# Patient Record
Sex: Female | Born: 1970 | Race: Black or African American | Hispanic: No | Smoking: Never smoker
Health system: Southern US, Community
[De-identification: ages and names within clinical notes are randomized; demographics above are authoritative.]

## PROBLEM LIST (undated history)

## (undated) DIAGNOSIS — T7840XA Allergy, unspecified, initial encounter: Secondary | ICD-10-CM

## (undated) DIAGNOSIS — I10 Essential (primary) hypertension: Secondary | ICD-10-CM

## (undated) DIAGNOSIS — Z98891 History of uterine scar from previous surgery: Secondary | ICD-10-CM

## (undated) HISTORY — DX: Allergy, unspecified, initial encounter: T78.40XA

## (undated) HISTORY — DX: Essential (primary) hypertension: I10

---

## 1996-04-24 HISTORY — PX: LEEP: SHX91

## 1998-03-15 ENCOUNTER — Other Ambulatory Visit: Admission: RE | Admit: 1998-03-15 | Discharge: 1998-03-15 | Payer: Self-pay | Admitting: Obstetrics and Gynecology

## 2005-04-25 ENCOUNTER — Inpatient Hospital Stay (HOSPITAL_COMMUNITY): Admission: AD | Admit: 2005-04-25 | Discharge: 2005-04-25 | Payer: Self-pay | Admitting: Obstetrics & Gynecology

## 2005-05-29 ENCOUNTER — Inpatient Hospital Stay (HOSPITAL_COMMUNITY): Admission: AD | Admit: 2005-05-29 | Discharge: 2005-05-29 | Payer: Self-pay | Admitting: Obstetrics and Gynecology

## 2005-05-30 ENCOUNTER — Inpatient Hospital Stay (HOSPITAL_COMMUNITY): Admission: AD | Admit: 2005-05-30 | Discharge: 2005-06-03 | Payer: Self-pay | Admitting: Obstetrics and Gynecology

## 2005-07-19 ENCOUNTER — Other Ambulatory Visit: Admission: RE | Admit: 2005-07-19 | Discharge: 2005-07-19 | Payer: Self-pay | Admitting: Obstetrics and Gynecology

## 2012-01-04 ENCOUNTER — Other Ambulatory Visit (HOSPITAL_COMMUNITY): Payer: Self-pay | Admitting: Obstetrics and Gynecology

## 2012-01-04 DIAGNOSIS — Z1231 Encounter for screening mammogram for malignant neoplasm of breast: Secondary | ICD-10-CM

## 2012-01-22 ENCOUNTER — Ambulatory Visit (HOSPITAL_COMMUNITY)
Admission: RE | Admit: 2012-01-22 | Discharge: 2012-01-22 | Disposition: A | Discharge status: Home / Self Care | Payer: Self-pay | Source: Ambulatory Visit | Attending: Obstetrics and Gynecology | Admitting: Obstetrics and Gynecology

## 2012-01-22 DIAGNOSIS — Z1231 Encounter for screening mammogram for malignant neoplasm of breast: Secondary | ICD-10-CM

## 2012-01-24 ENCOUNTER — Other Ambulatory Visit: Payer: Self-pay | Admitting: Obstetrics and Gynecology

## 2012-01-24 DIAGNOSIS — R928 Other abnormal and inconclusive findings on diagnostic imaging of breast: Secondary | ICD-10-CM

## 2012-02-05 ENCOUNTER — Encounter (HOSPITAL_COMMUNITY): Payer: Self-pay | Admitting: *Deleted

## 2012-02-13 ENCOUNTER — Encounter (HOSPITAL_COMMUNITY): Payer: Self-pay

## 2012-02-13 ENCOUNTER — Ambulatory Visit (HOSPITAL_COMMUNITY)
Admission: RE | Admit: 2012-02-13 | Discharge: 2012-02-13 | Disposition: A | Discharge status: Home / Self Care | Payer: Self-pay | Source: Ambulatory Visit | Attending: Obstetrics and Gynecology | Admitting: Obstetrics and Gynecology

## 2012-02-13 VITALS — BP 124/86 | Temp 98.8°F | Ht 68.5 in | Wt 195.0 lb

## 2012-02-13 DIAGNOSIS — Z1239 Encounter for other screening for malignant neoplasm of breast: Secondary | ICD-10-CM

## 2012-02-13 HISTORY — DX: History of uterine scar from previous surgery: Z98.891

## 2012-02-13 NOTE — Progress Notes (Signed)
Patient referred to [facility] by the [facility] for additional imaging of left breast. Screening mammogram was completed at [facility] 01/22/2012.  Pap Smear:    Pap smear not performed today. Per patient last Pap smear was in 2011 and normal. Unable to complete Pap smear today due to patient being on menstrual cycle. Patient to call us back to schedule Pap smear. No Pap smear results in EPIC.  Physical exam: Breasts Breasts symmetrical. No skin abnormalities bilateral breasts. No nipple retraction bilateral breasts. No nipple discharge bilateral breasts. No lymphadenopathy. No lumps palpated bilateral breasts. No complaints of pain or tenderness on exam. Referred patient to the [facility] for left breast diagnostic mammogram per recommendation and possible ultrasound. Appointment scheduled for Monday, February 19, 2012 at 1030.    Pelvic/Bimanual No Pap smear completed today since patient is on menstrual cycle. Patient to call and schedule Pap smear.

## 2012-02-13 NOTE — Patient Instructions (Addendum)
Taught patient how to perform BSE and gave educational materials to take home. Unable to complete Pap smear today due to patient is on menstrual cycle. Told patient to call us back to schedule Pap smear. Referred patient to the [facility] for left breast diagnostic mammogram and possible ultrasound. Appointment scheduled for Monday, February 19, 2012 at 1030. Patient aware of appointment and will be there. Let patient know will follow up with her within the next couple weeks with results. Patient verbalized understanding.

## 2012-02-15 ENCOUNTER — Other Ambulatory Visit: Payer: Self-pay | Admitting: Obstetrics and Gynecology

## 2012-02-15 DIAGNOSIS — R928 Other abnormal and inconclusive findings on diagnostic imaging of breast: Secondary | ICD-10-CM

## 2012-02-19 ENCOUNTER — Ambulatory Visit
Admission: RE | Admit: 2012-02-19 | Discharge: 2012-02-19 | Disposition: A | Discharge status: Home / Self Care | Payer: No Typology Code available for payment source | Source: Ambulatory Visit | Attending: Obstetrics and Gynecology | Admitting: Obstetrics and Gynecology

## 2012-02-19 DIAGNOSIS — R928 Other abnormal and inconclusive findings on diagnostic imaging of breast: Secondary | ICD-10-CM

## 2012-04-25 NOTE — Addendum Note (Signed)
Encounter addended by: Christine Poll Brannock, RN on: 04/25/2012  3:35 PM<BR>     Documentation filed: Visit Diagnoses, Charges VN

## 2012-05-14 ENCOUNTER — Ambulatory Visit (HOSPITAL_COMMUNITY)
Admission: RE | Admit: 2012-05-14 | Discharge: 2012-05-14 | Disposition: A | Discharge status: Home / Self Care | Payer: Self-pay | Source: Ambulatory Visit | Attending: Obstetrics and Gynecology | Admitting: Obstetrics and Gynecology

## 2012-05-14 ENCOUNTER — Encounter (HOSPITAL_COMMUNITY): Payer: Self-pay

## 2012-05-14 VITALS — BP 130/82 | Temp 98.7°F | Ht 68.5 in | Wt 198.0 lb

## 2012-05-14 DIAGNOSIS — Z01419 Encounter for gynecological examination (general) (routine) without abnormal findings: Secondary | ICD-10-CM

## 2012-05-14 NOTE — Patient Instructions (Signed)
Completed Pap smear today. Recommended patient have next Pap smear in 2 years if Pap smear normal since last Pap smear was around 7 years a ago and she stated she had a LEEP in 1999 for an abnormal Pap smear. Told patient she will need a screening mammogram in October 2014 and that she will need to schedule an appointment with [facility] to have her next mammogram covered through [facility].  Let patient know will follow up with her within the next couple weeks with results by letter or phone. Patient verbalized understanding.

## 2012-05-14 NOTE — Progress Notes (Signed)
No complaints today.  Pap Smear:    Pap smear completed today. Per patient last Pap smear was in 2007 and normal. Per patient had an abnormal Pap smear in 1999 that required a LEEP for follow up. No Pap smear results in EPIC.  Physical exam: Breasts Breast Exam not completed today since last exam was 02/13/2012.         Pelvic/Bimanual   Ext Genitalia No lesions, no swelling and no discharge observed on external genitalia.         Vagina Vagina pink and normal texture. No lesions and large amount of cervical mucous observed in vagina.          Cervix Cervix is present. Cervix pink and of normal texture. Cervical mucous observed on cervix.     Uterus Uterus is present and palpable. Uterus in normal position and normal size.        Adnexae Bilateral ovaries present and palpable. No tenderness on palpation.          Rectovaginal No rectal exam completed today since patient had no rectal complaints. No skin abnormalities observed on exam.

## 2012-06-17 ENCOUNTER — Encounter (HOSPITAL_COMMUNITY): Payer: Self-pay | Admitting: *Deleted

## 2012-06-21 ENCOUNTER — Telehealth (HOSPITAL_COMMUNITY): Payer: Self-pay | Admitting: *Deleted

## 2012-06-21 NOTE — Telephone Encounter (Signed)
Telephoned patient at home # and discussed negative pap smear results. Next pap smear due in 3 years. Patient voiced understanding.  

## 2014-02-23 ENCOUNTER — Encounter (HOSPITAL_COMMUNITY): Payer: Self-pay

## 2014-04-07 ENCOUNTER — Other Ambulatory Visit: Payer: Self-pay | Admitting: Obstetrics and Gynecology

## 2014-04-09 LAB — CYTOLOGY - PAP

## 2019-05-08 ENCOUNTER — Other Ambulatory Visit: Payer: Self-pay

## 2019-05-08 DIAGNOSIS — Z20822 Contact with and (suspected) exposure to covid-19: Secondary | ICD-10-CM

## 2019-05-09 ENCOUNTER — Telehealth: Payer: Self-pay

## 2019-05-09 LAB — NOVEL CORONAVIRUS, NAA: SARS-CoV-2, NAA: DETECTED — AB

## 2019-05-09 NOTE — Telephone Encounter (Signed)
Patient called in requesting confirmation of COVID19 lab results - DOB/Address verified  - confirmed Positive results. Reviewed Patient Positive Test and MyChart Companion Algorithm for Home Monitoring of [id number] with patient.  Patient reports the following symptoms:  Loss of taste/smell; dry cough. Patient does not have a PCP.  Patient verbalized understanding of instructions - no further questions.

## 2019-05-12 ENCOUNTER — Encounter: Payer: Self-pay | Admitting: *Deleted

## 2019-05-15 ENCOUNTER — Telehealth: Payer: Self-pay | Admitting: Family Medicine

## 2019-05-15 NOTE — Telephone Encounter (Signed)
Hello,  Patient is requesting to become a new patient. She a good friend of Lewanda Rife. Please Advise

## 2019-05-19 NOTE — Telephone Encounter (Signed)
DONE

## 2019-05-29 NOTE — Progress Notes (Addendum)
[facility] [address], [address] [place], [place] [address] [phone]  Date:  06/02/2019   Name:  Lynn Smith   DOB:  12/30/1970   MRN:  [id number]  PCP:  Pearline Cables, MD    Chief Complaint: New Patient (Initial Visit) (check up)   History of Present Illness:  Britaney Espaillat is a 50 y.o. 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  No breast changes or concerns  She would like colon cancer screening- her father died of colon cancer. Will refer to GI She did do cologuard a couple of times but would now like to pursue a colonoscopy  No recent labs  Give flu shot today   In her free time she enjoys reading, investing She has a 70 yo son who is in 8th grade.  He is learning online this year but we hope he will be back to in person learning next year for HS  Her menses are regular still  On exam noted likely frequent PVC. Pt has noted palpations since her covid infection, no CP or SOB however    There are no problems to display for this patient.   Past Medical History:  Diagnosis Date  . History of C-section     Past Surgical History:  Procedure Laterality Date  . CESAREAN SECTION    . LEEP  1998    Social History   Tobacco Use  . Smoking status: Never Smoker  . Smokeless tobacco: Never Used  Substance Use Topics  . Alcohol use: No  . Drug use: No    Family History  Problem Relation Age of Onset  . Hypertension Mother   . Cancer Mother        uterus  . Hypertension Father    . Cancer Father        colon and lung  . Hypertension Sister     No Known Allergies  Medication list has been reviewed and updated.  Current Outpatient Medications on File Prior to Visit  Medication Sig Dispense Refill  . Cholecalciferol (VITAMIN D3 PO) Take by mouth.    . Cinnamon 500 MG TABS Take by mouth.    . Lactobacillus (ACIDOPHILUS PO) Take by mouth.    . Multiple Vitamin (MULTIVITAMIN) tablet Take 1 tablet by mouth daily.    Marland Kitchen PHYTOSTEROLS PO Take by mouth.     No current facility-administered medications on file prior to visit.    Review of Systems:  As per HPI- otherwise negative.   Physical Examination: Vitals:   06/02/19 1016  BP: 132/90  Pulse: (!) 110  Resp: 16  Temp: (!) 97.2 F (36.2 C)  SpO2: 97%   Vitals:   06/02/19 1016  Weight: 193 lb (87.5 kg)  Height: 5\' 8"  (1.727 m)   Body mass index is 29.35 kg/m. Ideal Body Weight: Weight in (lb) to have BMI = 25: 164.1  GEN: WDWN, NAD, Non-toxic, A &  O x 3, overweight, looks well  HEENT: Atraumatic, Normocephalic. Neck supple. No masses, No LAD. Ears and Nose: No external deformity. CV: RRR, No M/G/R. No JVD. No thrill. No extra heart sounds. PULM: CTA B, no wheezes, crackles, rhonchi. No retractions. No resp. distress. No accessory muscle use. ABD: S, NT, ND, +BS. No rebound. No HSM. EXTR: No c/c/e NEURO Normal gait.  PSYCH: Normally interactive. Conversant. Not depressed or anxious appearing.  Calm demeanor.   EKG: sinus tach with frequent PACs- pt has no CP or SOB  Assessment and Plan: Palpitations - Plan: EKG 12-Lead  Screening for cervical cancer - Plan: Cytology - PAP  Encounter for screening mammogram for malignant neoplasm of breast - Plan: MM 3D SCREEN BREAST BILATERAL  Screening for colon cancer - Plan: Ambulatory referral to Gastroenterology  Screening for deficiency anemia - Plan: CBC  Screening for diabetes mellitus - Plan: Comprehensive metabolic panel, Hemoglobin  A1c  Screening for thyroid disorder - Plan: TSH  Screening for hyperlipidemia - Plan: Lipid panel  Cough with exposure to COVID-19 virus - Plan: DG Chest 2 View  Abnormal EKG - Plan: Troponin I (High Sensitivity)  In person visit today to establish care and catch up on health maint Pap and mammo today Labs pending Flu shot given Defer shingrix due to recent covid 19 infection  This visit occurred during the SARS-CoV-2 public health emergency.  Safety protocols were in place, including screening questions prior to the visit, additional usage of staff PPE, and extensive cleaning of exam room while observing appropriate contact time as indicated for disinfecting solutions.   Signed Abbe Amsterdam, MD  DG Chest 2 View  Result Date: 06/02/2019 CLINICAL DATA:  Cough after COVID-19 infection EXAM: CHEST - 2 VIEW COMPARISON:  None. FINDINGS: The heart size and mediastinal contours are within normal limits. Both lungs are clear. The visualized skeletal structures are unremarkable. IMPRESSION: No active cardiopulmonary disease. Electronically Signed   By: Elige Ko   On: 06/02/2019 11:28   I was able to speak with cardiology DOD, Dr. Jens Som.  He kindly reviewed her EKG and did suggest an echocardiogram as initial assessment Also received her labs as below Call patient as a result so far, and to set up echo Advised her as to prediabetes, encouraged diet and gradual weight loss.  Assuming echo is okay, would also recommend increasing exercise She states understanding and agreement  The 10-year ASCVD risk score Denman George DC Montez Hageman., et al., 2013) is: 2.1%   Values used to calculate the score:     Age: 37 years     Sex: Female     Is Non-Hispanic African American: Yes     Diabetic: No     Tobacco smoker: No     Systolic Blood Pressure: 132 mmHg     Is BP treated: No     HDL Cholesterol: 62.5 mg/dL     Total Cholesterol: 230 mg/dL   Results for orders placed or performed in visit on 06/02/19   CBC  Result Value Ref Range   WBC 5.2 4.0 - 10.5 K/uL   RBC 4.21 3.87 - 5.11 Mil/uL   Platelets 305.0 150.0 - 400.0 K/uL   Hemoglobin 12.7 12.0 - 15.0 g/dL   HCT 08.1 44.8 - 18.5 %   MCV 90.3 78.0 - 100.0 fl   MCHC 33.4 30.0 - 36.0 g/dL   RDW 63.1 49.7 - 02.6 %  Comprehensive metabolic panel  Result Value Ref Range   Sodium 138 135 -  145 mEq/L   Potassium 3.8 3.5 - 5.1 mEq/L   Chloride 106 96 - 112 mEq/L   CO2 25 19 - 32 mEq/L   Glucose, Bld 94 70 - 99 mg/dL   BUN 10 6 - 23 mg/dL   Creatinine, Ser 0.81 0.40 - 1.20 mg/dL   Total Bilirubin 0.5 0.2 - 1.2 mg/dL   Alkaline Phosphatase 65 39 - 117 U/L   AST 17 0 - 37 U/L   ALT 16 0 - 35 U/L   Total Protein 6.9 6.0 - 8.3 g/dL   Albumin 3.7 3.5 - 5.2 g/dL   GFR 90.55 >60.00 mL/min   Calcium 9.0 8.4 - 10.5 mg/dL  Hemoglobin A1c  Result Value Ref Range   Hgb A1c MFr Bld 6.2 4.6 - 6.5 %  Lipid panel  Result Value Ref Range   Cholesterol 230 (H) 0 - 200 mg/dL   Triglycerides 95.0 0.0 - 149.0 mg/dL   HDL 62.50 >39.00 mg/dL   VLDL 19.0 0.0 - 40.0 mg/dL   LDL Cholesterol 149 (H) 0 - 99 mg/dL   Total CHOL/HDL Ratio 4    NonHDL 167.56   TSH  Result Value Ref Range   TSH 1.39 0.35 - 4.50 uIU/mL  Troponin I (High Sensitivity)  Result Value Ref Range   High Sens Troponin I 3 2 - 17 ng/L   Received her pap, message to pt     High risk HPV Negative   Adequacy Satisfactory for evaluation; transformation zone component ABSENT.   Diagnosis - Negative for intraepithelial lesion or malignancy (NILM)   Comment Normal Reference Range HPV - Negative   Microorganisms Shift in flora suggestive of bacterial vaginosis

## 2019-05-30 ENCOUNTER — Other Ambulatory Visit: Payer: Self-pay

## 2019-06-02 ENCOUNTER — Encounter: Payer: Self-pay | Admitting: Family Medicine

## 2019-06-02 ENCOUNTER — Ambulatory Visit (INDEPENDENT_AMBULATORY_CARE_PROVIDER_SITE_OTHER): Payer: Managed Care, Other (non HMO) | Admitting: Family Medicine

## 2019-06-02 ENCOUNTER — Ambulatory Visit (HOSPITAL_BASED_OUTPATIENT_CLINIC_OR_DEPARTMENT_OTHER)
Admission: RE | Admit: 2019-06-02 | Discharge: 2019-06-02 | Disposition: A | Discharge status: Home / Self Care | Payer: Managed Care, Other (non HMO) | Source: Ambulatory Visit | Attending: Family Medicine | Admitting: Family Medicine

## 2019-06-02 ENCOUNTER — Other Ambulatory Visit: Payer: Self-pay

## 2019-06-02 ENCOUNTER — Other Ambulatory Visit (HOSPITAL_COMMUNITY)
Admission: RE | Admit: 2019-06-02 | Discharge: 2019-06-02 | Disposition: A | Discharge status: Home / Self Care | Payer: Managed Care, Other (non HMO) | Source: Ambulatory Visit | Attending: Family Medicine | Admitting: Family Medicine

## 2019-06-02 VITALS — BP 132/90 | HR 110 | Temp 97.2°F | Resp 16 | Ht 68.0 in | Wt 193.0 lb

## 2019-06-02 DIAGNOSIS — R05 Cough: Secondary | ICD-10-CM | POA: Insufficient documentation

## 2019-06-02 DIAGNOSIS — Z124 Encounter for screening for malignant neoplasm of cervix: Secondary | ICD-10-CM | POA: Insufficient documentation

## 2019-06-02 DIAGNOSIS — Z1211 Encounter for screening for malignant neoplasm of colon: Secondary | ICD-10-CM | POA: Diagnosis not present

## 2019-06-02 DIAGNOSIS — Z20822 Contact with and (suspected) exposure to covid-19: Secondary | ICD-10-CM

## 2019-06-02 DIAGNOSIS — Z13220 Encounter for screening for lipoid disorders: Secondary | ICD-10-CM

## 2019-06-02 DIAGNOSIS — R7303 Prediabetes: Secondary | ICD-10-CM

## 2019-06-02 DIAGNOSIS — R058 Other specified cough: Secondary | ICD-10-CM

## 2019-06-02 DIAGNOSIS — Z130 Encounter for screening for diseases of the blood and blood-forming organs and certain disorders involving the immune mechanism: Secondary | ICD-10-CM

## 2019-06-02 DIAGNOSIS — Z131 Encounter for screening for diabetes mellitus: Secondary | ICD-10-CM | POA: Diagnosis not present

## 2019-06-02 DIAGNOSIS — E785 Hyperlipidemia, unspecified: Secondary | ICD-10-CM | POA: Insufficient documentation

## 2019-06-02 DIAGNOSIS — Z8616 Personal history of COVID-19: Secondary | ICD-10-CM | POA: Diagnosis not present

## 2019-06-02 DIAGNOSIS — R002 Palpitations: Secondary | ICD-10-CM | POA: Insufficient documentation

## 2019-06-02 DIAGNOSIS — R9431 Abnormal electrocardiogram [ECG] [EKG]: Secondary | ICD-10-CM

## 2019-06-02 DIAGNOSIS — Z1329 Encounter for screening for other suspected endocrine disorder: Secondary | ICD-10-CM

## 2019-06-02 DIAGNOSIS — Z1231 Encounter for screening mammogram for malignant neoplasm of breast: Secondary | ICD-10-CM | POA: Diagnosis not present

## 2019-06-02 LAB — TSH: TSH: 1.39 u[IU]/mL (ref 0.35–4.50)

## 2019-06-02 LAB — COMPREHENSIVE METABOLIC PANEL
ALT: 16 U/L (ref 0–35)
AST: 17 U/L (ref 0–37)
Albumin: 3.7 g/dL (ref 3.5–5.2)
Alkaline Phosphatase: 65 U/L (ref 39–117)
BUN: 10 mg/dL (ref 6–23)
CO2: 25 mEq/L (ref 19–32)
Calcium: 9 mg/dL (ref 8.4–10.5)
Chloride: 106 mEq/L (ref 96–112)
Creatinine, Ser: 0.81 mg/dL (ref 0.40–1.20)
GFR: 90.55 mL/min (ref >60.00)
Glucose, Bld: 94 mg/dL (ref 70–99)
Potassium: 3.8 mEq/L (ref 3.5–5.1)
Sodium: 138 mEq/L (ref 135–145)
Total Bilirubin: 0.5 mg/dL (ref 0.2–1.2)
Total Protein: 6.9 g/dL (ref 6.0–8.3)

## 2019-06-02 LAB — CBC
HCT: 38 % (ref 36.0–46.0)
Hemoglobin: 12.7 g/dL (ref 12.0–15.0)
MCHC: 33.4 g/dL (ref 30.0–36.0)
MCV: 90.3 fl (ref 78.0–100.0)
Platelets: 305 10*3/uL (ref 150.0–400.0)
RBC: 4.21 Mil/uL (ref 3.87–5.11)
RDW: 13.8 % (ref 11.5–15.5)
WBC: 5.2 10*3/uL (ref 4.0–10.5)

## 2019-06-02 LAB — LIPID PANEL
Cholesterol: 230 mg/dL — ABNORMAL HIGH (ref 0–200)
HDL: 62.5 mg/dL (ref >39.00)
LDL Cholesterol: 149 mg/dL — ABNORMAL HIGH (ref 0–99)
NonHDL: 167.56
Total CHOL/HDL Ratio: 4
Triglycerides: 95 mg/dL (ref 0.0–149.0)
VLDL: 19 mg/dL (ref 0.0–40.0)

## 2019-06-02 LAB — TROPONIN I (HIGH SENSITIVITY): High Sens Troponin I: 3 ng/L (ref 2–17)

## 2019-06-02 LAB — HEMOGLOBIN A1C: Hgb A1c MFr Bld: 6.2 % (ref 4.6–6.5)

## 2019-06-02 NOTE — Patient Instructions (Addendum)
Great to meet you today-  I will be in touch with your labs and pap asap Please stop by imaging on the ground floor today and ask about mammogram scheduling Let's also have them do a chest x-ray for you today  We will get you set up to see GI for a colonoscopy evaluation  I am going to contact cardiology about your EKG and see if they have any concerns about your recent covid 19 infection    Health Maintenance, Female Adopting a healthy lifestyle and getting preventive care are important in promoting health and wellness. Ask your health care provider about:  The right schedule for you to have regular tests and exams.  Things you can do on your own to prevent diseases and keep yourself healthy. What should I know about diet, weight, and exercise? Eat a healthy diet   Eat a diet that includes plenty of vegetables, fruits, low-fat dairy products, and lean protein.  Do not eat a lot of foods that are high in solid fats, added sugars, or sodium. Maintain a healthy weight Body mass index (BMI) is used to identify weight problems. It estimates body fat based on height and weight. Your health care provider can help determine your BMI and help you achieve or maintain a healthy weight. Get regular exercise Get regular exercise. This is one of the most important things you can do for your health. Most adults should:  Exercise for at least 150 minutes each week. The exercise should increase your heart rate and make you sweat (moderate-intensity exercise).  Do strengthening exercises at least twice a week. This is in addition to the moderate-intensity exercise.  Spend less time sitting. Even light physical activity can be beneficial. Watch cholesterol and blood lipids Have your blood tested for lipids and cholesterol at 50 years of age, then have this test every 5 years. Have your cholesterol levels checked more often if:  Your lipid or cholesterol levels are high.  You are older than 50  years of age.  You are at high risk for heart disease. What should I know about cancer screening? Depending on your health history and family history, you may need to have cancer screening at various ages. This may include screening for:  Breast cancer.  Cervical cancer.  Colorectal cancer.  Skin cancer.  Lung cancer. What should I know about heart disease, diabetes, and high blood pressure? Blood pressure and heart disease  High blood pressure causes heart disease and increases the risk of stroke. This is more likely to develop in people who have high blood pressure readings, are of African descent, or are overweight.  Have your blood pressure checked: ? Every 3-5 years if you are 61-93 years of age. ? Every year if you are 70 years old or older. Diabetes Have regular diabetes screenings. This checks your fasting blood sugar level. Have the screening done:  Once every three years after age 16 if you are at a normal weight and have a low risk for diabetes.  More often and at a younger age if you are overweight or have a high risk for diabetes. What should I know about preventing infection? Hepatitis B If you have a higher risk for hepatitis B, you should be screened for this virus. Talk with your health care provider to find out if you are at risk for hepatitis B infection. Hepatitis C Testing is recommended for:  Everyone born from [birth year] through 1966.  Anyone with known risk factors for  hepatitis C. Sexually transmitted infections (STIs)  Get screened for STIs, including gonorrhea and chlamydia, if: ? You are sexually active and are younger than 50 years of age. ? You are older than 50 years of age and your health care provider tells you that you are at risk for this type of infection. ? Your sexual activity has changed since you were last screened, and you are at increased risk for chlamydia or gonorrhea. Ask your health care provider if you are at risk.  Ask your health  care provider about whether you are at high risk for HIV. Your health care provider may recommend a prescription medicine to help prevent HIV infection. If you choose to take medicine to prevent HIV, you should first get tested for HIV. You should then be tested every 3 months for as [place] as you are taking the medicine. Pregnancy  If you are about to stop having your period (premenopausal) and you may become pregnant, seek counseling before you get pregnant.  Take 400 to 800 micrograms (mcg) of folic acid every day if you become pregnant.  Ask for birth control (contraception) if you want to prevent pregnancy. Osteoporosis and menopause Osteoporosis is a disease in which the bones lose minerals and strength with aging. This can result in bone fractures. If you are 52 years old or older, or if you are at risk for osteoporosis and fractures, ask your health care provider if you should:  Be screened for bone loss.  Take a calcium or vitamin D supplement to lower your risk of fractures.  Be given hormone replacement therapy (HRT) to treat symptoms of menopause. Follow these instructions at home: Lifestyle  Do not use any products that contain nicotine or tobacco, such as cigarettes, e-cigarettes, and chewing tobacco. If you need help quitting, ask your health care provider.  Do not use street drugs.  Do not share needles.  Ask your health care provider for help if you need support or information about quitting drugs. Alcohol use  Do not drink alcohol if: ? Your health care provider tells you not to drink. ? You are pregnant, may be pregnant, or are planning to become pregnant.  If you drink alcohol: ? Limit how much you use to 0-1 drink a day. ? Limit intake if you are breastfeeding.  Be aware of how much alcohol is in your drink. In the [place] one drink equals one 12 oz bottle of beer (355 mL), one 5 oz glass of wine (148 mL), or one 1 oz glass of hard liquor (44 mL). General  instructions  Schedule regular health, dental, and eye exams.  Stay current with your vaccines.  Tell your health care provider if: ? You often feel depressed. ? You have ever been abused or do not feel safe at home. Summary  Adopting a healthy lifestyle and getting preventive care are important in promoting health and wellness.  Follow your health care provider's instructions about healthy diet, exercising, and getting tested or screened for diseases.  Follow your health care provider's instructions on monitoring your cholesterol and blood pressure. This information is not intended to replace advice given to you by your health care provider. Make sure you discuss any questions you have with your health care provider. Document Revised: 04/03/2018 Document Reviewed: 04/03/2018 Elsevier Patient Education  2020 [employer].

## 2019-06-03 ENCOUNTER — Encounter: Payer: Self-pay | Admitting: Family Medicine

## 2019-06-03 LAB — CYTOLOGY - PAP
Adequacy: ABSENT
Comment: NEGATIVE
Diagnosis: NEGATIVE
High risk HPV: NEGATIVE

## 2019-06-03 MED ORDER — METRONIDAZOLE 500 MG PO TABS: 500.0000 mg | ORAL_TABLET | Freq: Two times a day (BID) | ORAL | 0 refills | Status: DC

## 2019-06-10 ENCOUNTER — Other Ambulatory Visit: Payer: Self-pay

## 2019-06-10 ENCOUNTER — Encounter (HOSPITAL_BASED_OUTPATIENT_CLINIC_OR_DEPARTMENT_OTHER): Payer: Self-pay

## 2019-06-10 ENCOUNTER — Ambulatory Visit (HOSPITAL_BASED_OUTPATIENT_CLINIC_OR_DEPARTMENT_OTHER)
Admission: RE | Admit: 2019-06-10 | Discharge: 2019-06-10 | Disposition: A | Discharge status: Home / Self Care | Payer: Managed Care, Other (non HMO) | Source: Ambulatory Visit | Attending: Family Medicine | Admitting: Family Medicine

## 2019-06-10 ENCOUNTER — Other Ambulatory Visit: Payer: Self-pay | Admitting: Family Medicine

## 2019-06-10 DIAGNOSIS — Z1231 Encounter for screening mammogram for malignant neoplasm of breast: Secondary | ICD-10-CM | POA: Diagnosis present

## 2019-06-10 DIAGNOSIS — R921 Mammographic calcification found on diagnostic imaging of breast: Secondary | ICD-10-CM

## 2019-06-13 ENCOUNTER — Other Ambulatory Visit: Payer: Self-pay

## 2019-06-13 ENCOUNTER — Ambulatory Visit (HOSPITAL_COMMUNITY): Payer: Managed Care, Other (non HMO) | Attending: Cardiology

## 2019-06-13 DIAGNOSIS — R002 Palpitations: Secondary | ICD-10-CM | POA: Diagnosis present

## 2019-06-13 DIAGNOSIS — R9431 Abnormal electrocardiogram [ECG] [EKG]: Secondary | ICD-10-CM | POA: Diagnosis present

## 2019-06-15 ENCOUNTER — Encounter: Payer: Self-pay | Admitting: Family Medicine

## 2019-06-23 ENCOUNTER — Ambulatory Visit
Admission: RE | Admit: 2019-06-23 | Discharge: 2019-06-23 | Disposition: A | Discharge status: Home / Self Care | Payer: Managed Care, Other (non HMO) | Source: Ambulatory Visit | Attending: Family Medicine | Admitting: Family Medicine

## 2019-06-23 ENCOUNTER — Other Ambulatory Visit: Payer: Self-pay

## 2019-06-23 ENCOUNTER — Other Ambulatory Visit: Payer: Self-pay | Admitting: Family Medicine

## 2019-06-23 DIAGNOSIS — R921 Mammographic calcification found on diagnostic imaging of breast: Secondary | ICD-10-CM

## 2019-12-26 ENCOUNTER — Ambulatory Visit
Admission: RE | Admit: 2019-12-26 | Discharge: 2019-12-26 | Disposition: A | Discharge status: Home / Self Care | Payer: Managed Care, Other (non HMO) | Source: Ambulatory Visit | Attending: Family Medicine | Admitting: Family Medicine

## 2019-12-26 ENCOUNTER — Other Ambulatory Visit: Payer: Self-pay | Admitting: Family Medicine

## 2019-12-26 ENCOUNTER — Other Ambulatory Visit: Payer: Self-pay

## 2019-12-26 DIAGNOSIS — R921 Mammographic calcification found on diagnostic imaging of breast: Secondary | ICD-10-CM

## 2020-02-05 ENCOUNTER — Emergency Department (HOSPITAL_COMMUNITY): Payer: Managed Care, Other (non HMO)

## 2020-02-05 ENCOUNTER — Other Ambulatory Visit: Payer: Self-pay

## 2020-02-05 ENCOUNTER — Emergency Department (HOSPITAL_COMMUNITY)
Admission: EM | Admit: 2020-02-05 | Discharge: 2020-02-05 | Disposition: A | Discharge status: Home / Self Care | Payer: Managed Care, Other (non HMO) | Attending: Emergency Medicine | Admitting: Emergency Medicine

## 2020-02-05 ENCOUNTER — Encounter (HOSPITAL_COMMUNITY): Payer: Self-pay

## 2020-02-05 DIAGNOSIS — R519 Headache, unspecified: Secondary | ICD-10-CM | POA: Insufficient documentation

## 2020-02-05 DIAGNOSIS — R42 Dizziness and giddiness: Secondary | ICD-10-CM | POA: Diagnosis present

## 2020-02-05 DIAGNOSIS — I10 Essential (primary) hypertension: Secondary | ICD-10-CM

## 2020-02-05 LAB — BASIC METABOLIC PANEL
Anion gap: 8 (ref 5–15)
BUN: 14 mg/dL (ref 6–20)
CO2: 21 mmol/L — ABNORMAL LOW (ref 22–32)
Calcium: 8.7 mg/dL — ABNORMAL LOW (ref 8.9–10.3)
Chloride: 107 mmol/L (ref 98–111)
Creatinine, Ser: 0.78 mg/dL (ref 0.44–1.00)
GFR, Estimated: >60 mL/min (ref >60)
Glucose, Bld: 124 mg/dL — ABNORMAL HIGH (ref 70–99)
Potassium: 3.6 mmol/L (ref 3.5–5.1)
Sodium: 136 mmol/L (ref 135–145)

## 2020-02-05 LAB — CBC WITH DIFFERENTIAL/PLATELET
Abs Immature Granulocytes: 0.02 10*3/uL (ref 0.00–0.07)
Basophils Absolute: 0.1 10*3/uL (ref 0.0–0.1)
Basophils Relative: 1 %
Eosinophils Absolute: 0 10*3/uL (ref 0.0–0.5)
Eosinophils Relative: 1 %
HCT: 38.9 % (ref 36.0–46.0)
Hemoglobin: 12.2 g/dL (ref 12.0–15.0)
Immature Granulocytes: 0 %
Lymphocytes Relative: 27 %
Lymphs Abs: 1.8 10*3/uL (ref 0.7–4.0)
MCH: 29.4 pg (ref 26.0–34.0)
MCHC: 31.4 g/dL (ref 30.0–36.0)
MCV: 93.7 fL (ref 80.0–100.0)
Monocytes Absolute: 0.5 10*3/uL (ref 0.1–1.0)
Monocytes Relative: 8 %
Neutro Abs: 4.2 10*3/uL (ref 1.7–7.7)
Neutrophils Relative %: 63 %
Platelets: 376 10*3/uL (ref 150–400)
RBC: 4.15 MIL/uL (ref 3.87–5.11)
RDW: 12.9 % (ref 11.5–15.5)
WBC: 6.7 10*3/uL (ref 4.0–10.5)
nRBC: 0 % (ref 0.0–0.2)

## 2020-02-05 LAB — I-STAT BETA HCG BLOOD, ED (MC, WL, AP ONLY): I-stat hCG, quantitative: <5 m[IU]/mL (ref <5)

## 2020-02-05 LAB — CBG MONITORING, ED: Glucose-Capillary: 118 mg/dL — ABNORMAL HIGH (ref 70–99)

## 2020-02-05 MED ORDER — MECLIZINE HCL 25 MG PO TABS: 25.0000 mg | ORAL_TABLET | Freq: Three times a day (TID) | ORAL | 0 refills | Status: DC | PRN

## 2020-02-05 MED ORDER — LORAZEPAM 2 MG/ML IJ SOLN
1.0000 mg | Freq: Once | INTRAMUSCULAR | Status: AC
  Administered 2020-02-05: 1 mg via INTRAVENOUS
  Filled 2020-02-05: qty 1

## 2020-02-05 MED ORDER — MECLIZINE HCL 25 MG PO TABS
25.0000 mg | ORAL_TABLET | Freq: Once | ORAL | Status: AC
  Administered 2020-02-05: 25 mg via ORAL
  Filled 2020-02-05: qty 1

## 2020-02-05 MED ORDER — SODIUM CHLORIDE 0.9 % IV BOLUS
1000.0000 mL | Freq: Once | INTRAVENOUS | Status: AC
  Administered 2020-02-05: 1000 mL via INTRAVENOUS

## 2020-02-05 NOTE — ED Provider Notes (Signed)
[facility] EMERGENCY DEPARTMENT Provider Note   CSN: [id number] Arrival date & time: 02/05/20  1436     History Chief Complaint  Patient presents with  . Dizziness    Lynn Smith is a 49 y.o. female.  Patient is a 49 year old female with no significant past medical history.  She presents today for evaluation of dizziness.  Patient woke this morning from sleep with a headache, then shortly afterward began with dizziness.  She describes this as a "room spinning" sensation.  Does seem to be worse when she changes position and turns her head.  She denies any blurry vision or weakness or numbness in her arms or legs.  She denies any facial numbness or droop.  The history is provided by the patient.  Dizziness Quality:  Room spinning Severity:  Moderate Onset quality:  Sudden Duration:  6 hours Timing:  Constant Progression:  Unchanged Chronicity:  New Context: head movement and standing up   Relieved by:  Nothing Worsened by:  Movement and turning head      Past Medical History:  Diagnosis Date  . History of C-section     Patient Active Problem List   Diagnosis Date Noted  . Pre-diabetes 06/02/2019  . Dyslipidemia 06/02/2019    Past Surgical History:  Procedure Laterality Date  . CESAREAN SECTION    . LEEP  1998     OB History    Gravida  1   Para  1   Term  1   Preterm      AB      Living  1     SAB      TAB      Ectopic      Multiple      Live Births              Family History  Problem Relation Age of Onset  . Hypertension Mother   . Cancer Mother        uterus  . Hypertension Father   . Cancer Father        colon and lung  . Hypertension Sister     Social History   Tobacco Use  . Smoking status: Never Smoker  . Smokeless tobacco: Never Used  Substance Use Topics  . Alcohol use: No  . Drug use: No    Home Medications Prior to Admission medications   Medication Sig Start Date End Date Taking?  Authorizing Provider  Cholecalciferol (VITAMIN D3 PO) Take by mouth.    [provider]  Cinnamon 500 MG TABS Take by mouth.    [provider]  Lactobacillus (ACIDOPHILUS PO) Take by mouth.    [provider]  metroNIDAZOLE (FLAGYL) 500 MG tablet Take 1 tablet (500 mg total) by mouth 2 (two) times daily. 06/03/19   Copland, Gwenlyn Found, MD  Multiple Vitamin (MULTIVITAMIN) tablet Take 1 tablet by mouth daily.    [provider]  PHYTOSTEROLS PO Take by mouth.    [provider]    Allergies    Patient has no known allergies.  Review of Systems   Review of Systems  Neurological: Positive for dizziness.  All other systems reviewed and are negative.   Physical Exam Updated Vital Signs BP (!) 158/98   Pulse 80   Temp 98.4 F (36.9 C) (Oral)   Resp 16   Ht 5\' 8"  (1.727 m)   Wt 86.2 kg   SpO2 97%   BMI 28.89 kg/m  Physical Exam Vitals and nursing note reviewed.  Constitutional:      General: She is not in acute distress.    Appearance: She is well-developed. She is not diaphoretic.  HENT:     Head: Normocephalic and atraumatic.  Eyes:     Extraocular Movements: Extraocular movements intact.     Pupils: Pupils are equal, round, and reactive to light.  Cardiovascular:     Rate and Rhythm: Normal rate and regular rhythm.     Heart sounds: No murmur heard.  No friction rub. No gallop.   Pulmonary:     Effort: Pulmonary effort is normal. No respiratory distress.     Breath sounds: Normal breath sounds. No wheezing.  Abdominal:     General: Bowel sounds are normal. There is no distension.     Palpations: Abdomen is soft.     Tenderness: There is no abdominal tenderness.  Musculoskeletal:        General: Normal range of motion.     Cervical back: Normal range of motion and neck supple.  Skin:    General: Skin is warm and dry.  Neurological:     General: No focal deficit present.     Mental Status: She is alert and oriented to  person, place, and time.     Cranial Nerves: No cranial nerve deficit.     Sensory: No sensory deficit.     Motor: No weakness.     Coordination: Coordination normal.     ED Results / Procedures / Treatments   Labs (all labs ordered are listed, but only abnormal results are displayed) Labs Reviewed  CBG MONITORING, ED - Abnormal; Notable for the following components:      Result Value   Glucose-Capillary 118 (*)    All other components within normal limits  BASIC METABOLIC PANEL  CBC WITH DIFFERENTIAL/PLATELET  I-STAT BETA HCG BLOOD, ED ([facility], [facility], [facility] ONLY)    EKG EKG Interpretation  Date/Time:  Thursday February 05 2020 14:38:18 EDT Ventricular Rate:  80 PR Interval:    QRS Duration: 87 QT Interval:  367 QTC Calculation: 424 R Axis:   28 Text Interpretation: Sinus rhythm Probable left atrial enlargement Abnormal R-wave progression, early transition Borderline T abnormalities, diffuse leads No prior ecg for comparison Confirmed by Geoffery Lyons ([id number]) on 02/05/2020 2:46:32 PM   Radiology No results found.  Procedures Procedures (including critical care time)  Medications Ordered in ED Medications  sodium chloride 0.9 % bolus 1,000 mL (has no administration in time range)  LORazepam (ATIVAN) injection 1 mg (has no administration in time range)  meclizine (ANTIVERT) tablet 25 mg (has no administration in time range)    ED Course  I have reviewed the triage vital signs and the nursing notes.  Pertinent labs & imaging results that were available during my care of the patient were reviewed by me and considered in my medical decision making (see chart for details).    MDM Rules/Calculators/A&P  Patient is a 49 year old female with no significant past medical history.  She presents today for headache that started this morning upon wakening, then dizziness began afterward.  She describes it as a spinning sensation and I highly suspect a peripheral vertigo likely etiology.   Due to the headache, a CT was obtained and was unremarkable.  Laboratory studies are unremarkable.  Patient to receive IV fluids, Ativan, and meclizine and will be reassessed at that time.  If she is feeling better I feel as though discharge with meclizine  is appropriate.  If she is not feeling any better, she may require MRI to rule out posterior circulation stroke.  Care to be signed out to Dr. Renaye Rakers at shift change.  He will reassess after medication administration and determine the final disposition.  Final Clinical Impression(s) / ED Diagnoses Final diagnoses:  None    Rx / DC Orders ED Discharge Orders    None       Geoffery Lyons, MD 02/05/20 1635

## 2020-02-05 NOTE — ED Triage Notes (Signed)
Pt arrived via EMS from a fire station. Pt states she woke up feeling well and denies any pmh or prescription medication use. Pt states she began feeling dizzy this morning after drinking coffee. Pt states while driving this afternoon the dizziness got worse and so she pulled over at a nearby fire station. Pt denies any symptoms similar to this happening in the past. Pt denies any cp or sob.

## 2020-02-05 NOTE — Discharge Instructions (Signed)
Please follow-up with your primary care provider about your high blood pressure.  I advised that you buy a diary and keep a daily record of your blood pressures.  Try to eat a low-salt diet.  In terms of your vertigo, or dizziness, included information for you to read over.  You can schedule a follow-up appointment in 1 to 2 weeks with [facility] ear specialist at the number provided above.  If your dizziness is in fact coming from inside your ears, they may be able to help you manage these symptoms.

## 2020-02-05 NOTE — ED Notes (Signed)
Patient verbalizes understanding of discharge instructions. Opportunity for questioning and answers were provided. Pt discharged from ED via wheelchair.  

## 2020-02-05 NOTE — ED Provider Notes (Signed)
Patient reports significant symptomatic improvement.  Okay for discharge with family member.  Will prescribe meclizine, advise neuro f/u for vestibular clinic.   Terald Sleeper, MD 02/05/20 [phone]

## 2020-02-09 ENCOUNTER — Encounter: Payer: Self-pay | Admitting: Medical

## 2020-02-09 ENCOUNTER — Ambulatory Visit (INDEPENDENT_AMBULATORY_CARE_PROVIDER_SITE_OTHER): Payer: Managed Care, Other (non HMO) | Admitting: Medical

## 2020-02-09 ENCOUNTER — Other Ambulatory Visit: Payer: Self-pay

## 2020-02-09 VITALS — BP 153/98 | HR 93 | Temp 97.9°F | Resp 18 | Ht 68.0 in | Wt 189.0 lb

## 2020-02-09 DIAGNOSIS — I10 Essential (primary) hypertension: Secondary | ICD-10-CM

## 2020-02-09 DIAGNOSIS — R42 Dizziness and giddiness: Secondary | ICD-10-CM | POA: Diagnosis not present

## 2020-02-09 MED ORDER — LOSARTAN POTASSIUM 50 MG PO TABS: 50.0000 mg | ORAL_TABLET | Freq: Every day | ORAL | 0 refills | Status: DC

## 2020-02-09 NOTE — Patient Instructions (Addendum)
Recent hypertension.  You had some vertigo recently with  improved symptoms since discharge from emergency department 4 days ago.  Neurologic exam negative/normal except for mild heel-to-toe gait difficulty.  We will treat hypertension with losartan 50 mg daily.  Recommended to use meclizine 1 tablet every 8 hours as needed severe dizziness.  If you have worsening signs symptoms as discussed then recommend reevaluation at the emergency department.  Emergency department doctor mentioned potential MRI if your symptoms do not improve.  Overall symptoms have improved so I do not think MRI indicated presently.  Change/worsening symptoms would necessitate getting MRI.  [facility] and [facility] ED have MRI capability.  Update me if you have any changing or worsening symptoms.  Follow-up this Friday or as needed.

## 2020-02-09 NOTE — Progress Notes (Signed)
Subjective:    Patient ID: Lynn Smith, female    DOB: 1970-12-20, 49 y.o.   MRN: [id number]  HPI  Pt in for some dizziness that started 4 days ago. ED evaluation 4 days ago.  hpi in ED.  "Lynn Smith is a 49 y.o. female.  Patient is a 49 year old female with no significant past medical history.  She presents today for evaluation of dizziness.  Patient woke this morning from sleep with a headache, then shortly afterward began with dizziness.  She describes this as a "room spinning" sensation.  Does seem to be worse when she changes position and turns her head.  She denies any blurry vision or weakness or numbness in her arms or legs.  She denies any facial numbness or droop.  The history is provided by the patient.  Dizziness Quality:  Room spinning Severity:  Moderate Onset quality:  Sudden Duration:  6 hours Timing:  Constant Progression:  Unchanged Chronicity:  New Context: head movement and standing up   Relieved by:  Nothing Worsened by:  Movement and turning head"  Pt had ct of her head which showed   IMPRESSION: No evidence of acute intracranial abnormality.  ekg showed sinus rhythm.  Sugar was mild elevated at time of labs. Just drank coffee.  Bmp showed blood sugar of 124. Normal electrolytes.  No anemia and normal infection fighting cells.   A/P from ED below. "Patient is a 49 year old female with no significant past medical history.  She presents today for headache that started this morning upon wakening, then dizziness began afterward.  She describes it as a spinning sensation and I highly suspect a peripheral vertigo likely etiology.  Due to the headache, a CT was obtained and was unremarkable.  Laboratory studies are unremarkable.  Patient to receive IV fluids, Ativan, and meclizine and will be reassessed at that time.  If she is feeling better I feel as though discharge with meclizine is appropriate.  If she is not feeling any better, she may require MRI  to rule out posterior circulation stroke.  Care to be signed out to Dr. Renaye Rakers at shift change.  He will reassess after medication administration and determine the final disposition."   Pt was discharged on meclizine.  Pt states overall she feels a lot better. No veritigo but feels off balance/light headed. No chest pain. No gross motor or sensory function deficits.  Pt bp elevated in ED and elevated today. Pt states brief high blood pressure at time of birth of her son 14 years ago. Very brief use of bp med for couple of weeks then bp got better.  Pt has some moderate stress recently. She is Development worker, community. States day of going to ED was having some higher level stress.   Review of Systems  Constitutional: Negative for chills, fatigue and fever.  Respiratory: Negative for cough, chest tightness, shortness of breath and wheezing.   Cardiovascular: Negative for chest pain and palpitations.  Musculoskeletal: Negative for back pain.  Skin: Negative for rash.  Neurological: Positive for dizziness. Negative for syncope, facial asymmetry, speech difficulty, weakness, numbness and headaches.  Hematological: Negative for adenopathy. Does not bruise/bleed easily.  Psychiatric/Behavioral: Negative for behavioral problems, decreased concentration and sleep disturbance. The patient is not nervous/anxious.     Past Medical History:  Diagnosis Date   History of C-section      Social History   Socioeconomic History   Marital status: Divorced    Spouse name: Not on file  Number of children: Not on file   Years of education: Not on file   Highest education level: Not on file  Occupational History   Not on file  Tobacco Use   Smoking status: Never Smoker   Smokeless tobacco: Never Used  Substance and Sexual Activity   Alcohol use: No   Drug use: No   Sexual activity: Not Currently    Birth control/protection: None  Other Topics Concern   Not on file  Social History  Narrative   Not on file   Social Determinants of Health   Financial Resource Strain:    Difficulty of Paying Living Expenses: Not on file  Food Insecurity:    Worried About Running Out of Food in the Last Year: Not on file   Ran Out of Food in the Last Year: Not on file  Transportation Needs:    Lack of Transportation (Medical): Not on file   Lack of Transportation (Non-Medical): Not on file  Physical Activity:    Days of Exercise per Week: Not on file   Minutes of Exercise per Session: Not on file  Stress:    Feeling of Stress : Not on file  Social Connections:    Frequency of Communication with Friends and Family: Not on file   Frequency of Social Gatherings with Friends and Family: Not on file   Attends Religious Services: Not on file   Active Member of Clubs or Organizations: Not on file   Attends Banker Meetings: Not on file   Marital Status: Not on file  Intimate Partner Violence:    Fear of Current or Ex-Partner: Not on file   Emotionally Abused: Not on file   Physically Abused: Not on file   Sexually Abused: Not on file    Past Surgical History:  Procedure Laterality Date   CESAREAN SECTION     LEEP  1998    Family History  Problem Relation Age of Onset   Hypertension Mother    Cancer Mother        uterus   Hypertension Father    Cancer Father        colon and lung   Hypertension Sister     No Known Allergies  Current Outpatient Medications on File Prior to Visit  Medication Sig Dispense Refill   ascorbic acid (VITAMIN C) 1000 MG tablet Take 1,000 mg by mouth daily.     Barberry-Oreg Grape-Goldenseal (BERBERINE COMPLEX PO) Take 1 tablet by mouth daily.     Cholecalciferol (VITAMIN D3 PO) Take by mouth.     Chromium 500 MCG TABS Take 500 mcg by mouth daily.     ELDERBERRY PO Take 1 capsule by mouth daily.     Lactobacillus (ACIDOPHILUS PO) Take by mouth.     meclizine (ANTIVERT) 25 MG tablet Take 1 tablet  (25 mg total) by mouth 3 (three) times daily as needed for up to 30 doses for dizziness. 30 tablet 0   Multiple Vitamin (MULTIVITAMIN) tablet Take 1 tablet by mouth daily.     PHYTOSTEROLS PO Take 1 capsule by mouth daily.      Turmeric (QC TUMERIC COMPLEX) 500 MG CAPS Take 500 mg by mouth daily.     zinc gluconate 50 MG tablet Take 50 mg by mouth daily.     metroNIDAZOLE (FLAGYL) 500 MG tablet Take 1 tablet (500 mg total) by mouth 2 (two) times daily. (Patient not taking: Reported on 02/05/2020) 14 tablet 0   No current facility-administered  medications on file prior to visit.    BP (!) 162/96    Pulse 93    Temp 97.9 F (36.6 C) (Oral)    Resp 18    Ht 5\' 8"  (1.727 m)    Wt 189 lb (85.7 kg)    SpO2 97%    BMI 28.74 kg/m       Objective:   Physical Exam  General Mental Status- Alert. General Appearance- Not in acute distress.   Skin General: Color- Normal Color. Moisture- Normal Moisture.  Neck Carotid Arteries- Normal color. Moisture- Normal Moisture. No carotid bruits. No JVD.  Chest and Lung Exam Auscultation: Breath Sounds:-Normal.  Cardiovascular Auscultation:Rythm- Regular. Murmurs & Other Heart Sounds:Auscultation of the heart reveals- No Murmurs.  Abdomen Inspection:-Inspeection Normal. Palpation/Percussion:Note:No mass. Palpation and Percussion of the abdomen reveal- Non Tender, Non Distended + BS, no rebound or guarding.   Neurologic Cranial Nerve exam:- CN III-XII intact(No nystagmus), symmetric smile. Drift Test:- No drift. Romberg Exam:- Negative.  Heal to Toe Gait exam:- off balance. After couple of heal to toe gaits lost balance/mistepped. Finger to Nose:- Normal/Intact Strength:- 5/5 equal and symmetric strength both upper and lower extremities. Lying supine- turning head to the left and to the right causes no vertigo. Supine to sitting no vertigo as well.      Assessment & Plan:  Recent hypertension.  You had some vertigo recently with   improved symptoms since discharge from emergency department 4 days ago.  Neurologic exam negative/normal except for mild heel-to-toe gait difficulty.  We will treat hypertension with losartan 50 mg daily.  Recommended to use meclizine 1 tablet every 8 hours as needed severe dizziness.  If you have worsening signs symptoms as discussed then recommend reevaluation at the emergency department.  Emergency department doctor mentioned potential MRI if your symptoms do not improve.  Overall symptoms have improved so I do not think MRI indicated presently.  Change/worsening symptoms would necessitate getting MRI.  [facility] and [facility] ED have MRI capability.  Update me if you have any changing or worsening symptoms.  Follow-up this Friday or as needed.  Time spent with patient today was 40  minutes which consisted of chart review, discussing diagnosis, work up, treatment, answering question, explaining signs/symptoms that would necessitate ED evaluation and documentation.  Wednesday, PA-C

## 2020-02-13 ENCOUNTER — Other Ambulatory Visit: Payer: Self-pay

## 2020-02-13 ENCOUNTER — Encounter: Payer: Self-pay | Admitting: Medical

## 2020-02-13 ENCOUNTER — Ambulatory Visit (INDEPENDENT_AMBULATORY_CARE_PROVIDER_SITE_OTHER): Payer: Managed Care, Other (non HMO) | Admitting: Medical

## 2020-02-13 VITALS — BP 145/88 | HR 85 | Resp 18 | Ht 68.0 in | Wt 198.6 lb

## 2020-02-13 DIAGNOSIS — J309 Allergic rhinitis, unspecified: Secondary | ICD-10-CM | POA: Diagnosis not present

## 2020-02-13 DIAGNOSIS — R42 Dizziness and giddiness: Secondary | ICD-10-CM | POA: Diagnosis not present

## 2020-02-13 DIAGNOSIS — I10 Essential (primary) hypertension: Secondary | ICD-10-CM

## 2020-02-13 MED ORDER — FLUTICASONE PROPIONATE 50 MCG/ACT NA SUSP: 2.0000 | Freq: Every day | NASAL | 1 refills | Status: DC

## 2020-02-13 MED ORDER — LOSARTAN POTASSIUM 100 MG PO TABS: 100.0000 mg | ORAL_TABLET | Freq: Every day | ORAL | 3 refills | Status: DC

## 2020-02-13 MED ORDER — LEVOCETIRIZINE DIHYDROCHLORIDE 5 MG PO TABS: 5.0000 mg | ORAL_TABLET | Freq: Every evening | ORAL | 3 refills | Status: DC

## 2020-02-13 NOTE — Progress Notes (Signed)
Subjective:    Patient ID: Noemi Chapel, female    DOB: [birth year], 49 y.o.   MRN: [id number]  HPI  Pt in for follow up.  A/P last visit below.  "Recent hypertension.  You had some vertigo recently with  improved symptoms since discharge from emergency department 4 days ago.  Neurologic exam negative/normal except for mild heel-to-toe gait difficulty.  We will treat hypertension with losartan 50 mg daily.  Recommended to use meclizine 1 tablet every 8 hours as needed severe dizziness.  If you have worsening signs symptoms as discussed then recommend reevaluation at the emergency department.  Emergency department doctor mentioned potential MRI if your symptoms do not improve.  Overall symptoms have improved so I do not think MRI indicated presently.  Change/worsening symptoms would necessitate getting MRI.  [facility] and [facility] ED have MRI capability.  Update me if you have any changing or worsening symptoms."   Pt started losartan on Monday. Takes med at noon.   Pt has not checked her bp since last visit. Pt states she feels way better that she felt last time. She feels that her balance is better.  Pt is also feeling ear pressure and some sinus pressure. Pt has been sneezing on and off. Pt took sudafed day before yesterday. Also took sudafed day she went to ED.    Review of Systems  Constitutional: Negative for fatigue and fever.  HENT: Positive for congestion and ear pain. Negative for nosebleeds, postnasal drip, sinus pressure and sinus pain.   Respiratory: Negative for cough, choking, chest tightness, shortness of breath and wheezing.   Cardiovascular: Negative for chest pain and palpitations.  Gastrointestinal: Negative for abdominal pain.  Skin: Negative for rash.  Neurological: Positive for dizziness. Negative for speech difficulty, weakness and light-headedness.       Dizziness is much improved. Now. Residual.  Hematological: Negative for adenopathy. Does not bruise/bleed  easily.  Psychiatric/Behavioral: Negative for behavioral problems and decreased concentration.    Past Medical History:  Diagnosis Date  . History of C-section      Social History   Socioeconomic History  . Marital status: Divorced    Spouse name: Not on file  . Number of children: Not on file  . Years of education: Not on file  . Highest education level: Not on file  Occupational History  . Not on file  Tobacco Use  . Smoking status: Never Smoker  . Smokeless tobacco: Never Used  Substance and Sexual Activity  . Alcohol use: No  . Drug use: No  . Sexual activity: Not Currently    Birth control/protection: None  Other Topics Concern  . Not on file  Social History Narrative  . Not on file   Social Determinants of Health   Financial Resource Strain:   . Difficulty of Paying Living Expenses: Not on file  Food Insecurity:   . Worried About Programme researcher, broadcasting/film/video in the Last Year: Not on file  . Ran Out of Food in the Last Year: Not on file  Transportation Needs:   . Lack of Transportation (Medical): Not on file  . Lack of Transportation (Non-Medical): Not on file  Physical Activity:   . Days of Exercise per Week: Not on file  . Minutes of Exercise per Session: Not on file  Stress:   . Feeling of Stress : Not on file  Social Connections:   . Frequency of Communication with Friends and Family: Not on file  . Frequency of  Social Gatherings with Friends and Family: Not on file  . Attends Religious Services: Not on file  . Active Member of Clubs or Organizations: Not on file  . Attends Banker Meetings: Not on file  . Marital Status: Not on file  Intimate Partner Violence:   . Fear of Current or Ex-Partner: Not on file  . Emotionally Abused: Not on file  . Physically Abused: Not on file  . Sexually Abused: Not on file    Past Surgical History:  Procedure Laterality Date  . CESAREAN SECTION    . LEEP  1998    Family History  Problem Relation Age of  Onset  . Hypertension Mother   . Cancer Mother        uterus  . Hypertension Father   . Cancer Father        colon and lung  . Hypertension Sister     No Known Allergies  Current Outpatient Medications on File Prior to Visit  Medication Sig Dispense Refill  . ascorbic acid (VITAMIN C) 1000 MG tablet Take 1,000 mg by mouth daily.    Claris Gower Grape-Goldenseal (BERBERINE COMPLEX PO) Take 1 tablet by mouth daily.    . Cholecalciferol (VITAMIN D3 PO) Take by mouth.    . Chromium 500 MCG TABS Take 500 mcg by mouth daily.    Marland Kitchen ELDERBERRY PO Take 1 capsule by mouth daily.    . Lactobacillus (ACIDOPHILUS PO) Take by mouth.    . losartan (COZAAR) 50 MG tablet Take 1 tablet (50 mg total) by mouth daily. 30 tablet 0  . meclizine (ANTIVERT) 25 MG tablet Take 1 tablet (25 mg total) by mouth 3 (three) times daily as needed for up to 30 doses for dizziness. 30 tablet 0  . Multiple Vitamin (MULTIVITAMIN) tablet Take 1 tablet by mouth daily.    Marland Kitchen PHYTOSTEROLS PO Take 1 capsule by mouth daily.     . Turmeric (QC TUMERIC COMPLEX) 500 MG CAPS Take 500 mg by mouth daily.    Marland Kitchen zinc gluconate 50 MG tablet Take 50 mg by mouth daily.    . metroNIDAZOLE (FLAGYL) 500 MG tablet Take 1 tablet (500 mg total) by mouth 2 (two) times daily. (Patient not taking: Reported on 02/05/2020) 14 tablet 0   No current facility-administered medications on file prior to visit.    BP (!) 153/99   Pulse 85   Resp 18   Ht 5\' 8"  (1.727 m)   Wt 198 lb 9.6 oz (90.1 kg)   SpO2 96%   BMI 30.20 kg/m       Objective:   Physical Exam  General Mental Status- Alert. General Appearance- Not in acute distress.   Skin General: Color- Normal Color. Moisture- Normal Moisture.  Neck Carotid Arteries- Normal color. Moisture- Normal Moisture. No carotid bruits. No JVD.  Chest and Lung Exam Auscultation: Breath Sounds:-Normal.  Cardiovascular Auscultation:Rythm- Regular. Murmurs & Other Heart Sounds:Auscultation of  the heart reveals- No Murmurs.  Abdomen Inspection:-Inspeection Normal. Palpation/Percussion:Note:No mass. Palpation and Percussion of the abdomen reveal- Non Tender, Non Distended + BS, no rebound or guarding.    Neurologic Cranial Nerve exam:- CN III-XII intact(No nystagmus), symmetric smile. Strength:- 5/5 equal and symmetric strength both upper and lower extremities. Heal to toe gait intact. Much better.  Finger to nose intact.        Assessment & Plan:  Your bp is better but still not where we want it to be. Will rx higher dose losartan 100  mg daily.  Your dizziness is much improved. Normal neurologic. Heel to toe gait normal today.  Allergic rhinitis. Rx xyzal and flonase nasal spray. Stop sudafed as that can increase blood pressure.  Conversation today on if mri imaging indicted you are improving and heel to toe gait is normal. I don't think indicated. I think with bp control and treating allergies symptoms will resolve residual dizziness. If residual dizziness persist or worsens again could try to get prior auth mri or refer to neurologist as you are considering as family member thinks indicated.  Not if acute severe new symptoms occur then CT would be indicated to avoid delay on mri prior auth.  Follow up in 2-3 weeks with pcp or as needed  Time spent with patient today was 30  minutes which consisted of chart review, discussing diagnosis, work up, treatment, answering questions  and documentation.

## 2020-02-13 NOTE — Patient Instructions (Addendum)
Your bp is better but still not where we want it to be. Will rx higher dose losartan 100 mg daily.  Your dizziness is much improved. Normal neurologic. Heel to toe gait normal today.  Allergic rhinitis. Rx xyzal and flonase nasal spray. Stop sudafed as that can increase blood pressure.  Conversation today on if mri imaging indicted . Since you are improving and heel to toe gait is normal. I don't think indicated. I think with bp control and treating allergies symptoms will resolve residual dizziness. If residual dizziness persist or worsens again could try to get prior auth mri or refer to neurologist as you are considering mri as family member thinks indicated.  Not if acute severe new symptoms occur then CT would be indicated to avoid delay on mri prior auth.  Follow up in 2-3 weeks with pcp or as needed

## 2020-02-26 ENCOUNTER — Encounter: Payer: Self-pay | Admitting: Internal Medicine

## 2020-03-03 ENCOUNTER — Other Ambulatory Visit: Payer: Self-pay | Admitting: Medical

## 2020-03-11 ENCOUNTER — Ambulatory Visit: Payer: Managed Care, Other (non HMO) | Admitting: Family Medicine

## 2020-04-09 ENCOUNTER — Encounter: Payer: Managed Care, Other (non HMO) | Admitting: Internal Medicine

## 2020-05-12 ENCOUNTER — Other Ambulatory Visit: Payer: Self-pay

## 2020-05-12 ENCOUNTER — Ambulatory Visit (AMBULATORY_SURGERY_CENTER): Payer: Self-pay | Admitting: *Deleted

## 2020-05-12 VITALS — Ht 68.0 in | Wt 200.0 lb

## 2020-05-12 DIAGNOSIS — Z1211 Encounter for screening for malignant neoplasm of colon: Secondary | ICD-10-CM

## 2020-05-12 NOTE — Progress Notes (Signed)

## 2020-05-19 ENCOUNTER — Encounter: Payer: Self-pay | Admitting: Internal Medicine

## 2020-05-22 ENCOUNTER — Other Ambulatory Visit: Payer: Self-pay | Admitting: Medical

## 2020-05-26 ENCOUNTER — Ambulatory Visit (AMBULATORY_SURGERY_CENTER): Payer: Managed Care, Other (non HMO) | Admitting: Internal Medicine

## 2020-05-26 ENCOUNTER — Encounter: Payer: Self-pay | Admitting: Internal Medicine

## 2020-05-26 ENCOUNTER — Other Ambulatory Visit: Payer: Self-pay

## 2020-05-26 VITALS — BP 149/95 | HR 75 | Temp 96.8°F | Resp 18 | Ht 68.0 in | Wt 200.0 lb

## 2020-05-26 DIAGNOSIS — Z1211 Encounter for screening for malignant neoplasm of colon: Secondary | ICD-10-CM

## 2020-05-26 MED ORDER — SODIUM CHLORIDE 0.9 % IV SOLN: 500.0000 mL | INTRAVENOUS | Status: DC

## 2020-05-26 NOTE — Progress Notes (Signed)
Vs CW  Pt's states no medical or surgical changes since previsit or office visit.  

## 2020-05-26 NOTE — Progress Notes (Signed)
Report given to PACU, vss 

## 2020-05-26 NOTE — Op Note (Signed)
[facility] Patient Name: Lynn Smith Procedure Date: 05/26/2020 10:25 AM MRN: [id number] Endoscopist: Iva Boop , MD Age: 50 Referring MD:  Date of Birth: [birth year] Gender: Female Account #: [id number] Procedure:                Colonoscopy Indications:              Screening for colorectal malignant neoplasm, This                            is the patient's first colonoscopy Medicines:                Propofol per Anesthesia, Monitored Anesthesia Care Procedure:                Pre-Anesthesia Assessment:                           - Prior to the procedure, a History and Physical                            was performed, and patient medications and                            allergies were reviewed. The patient's tolerance of                            previous anesthesia was also reviewed. The risks                            and benefits of the procedure and the sedation                            options and risks were discussed with the patient.                            All questions were answered, and informed consent                            was obtained. Prior Anticoagulants: The patient has                            taken no previous anticoagulant or antiplatelet                            agents. ASA Grade Assessment: II - A patient with                            mild systemic disease. After reviewing the risks                            and benefits, the patient was deemed in                            satisfactory condition to undergo the procedure.  After obtaining informed consent, the colonoscope                            was passed under direct vision. Throughout the                            procedure, the patient's blood pressure, pulse, and                            oxygen saturations were monitored continuously. The                            Olympus PFC-[id number]) Colonoscope was                             introduced through the anus and advanced to the the                            cecum, identified by appendiceal orifice and                            ileocecal valve. The ileocecal valve, appendiceal                            orifice, and rectum were photographed. The quality                            of the bowel preparation was excellent. The bowel                            preparation used was Miralax via split dose                            instruction. Scope In: 10:40:49 AM Scope Out: 10:51:46 AM Scope Withdrawal Time: 0 hours 7 minutes 57 seconds  Total Procedure Duration: 0 hours 10 minutes 57 seconds  Findings:                 The perianal and digital rectal examinations were                            normal.                           Scattered diverticula were found in the entire                            colon.                           The exam was otherwise without abnormality on                            direct and retroflexion views. Complications:            No immediate complications. Estimated blood loss:  None. Estimated Blood Loss:     Estimated blood loss: none. Impression:               - Mild diverticulosis in the entire examined colon.                           - The examination was otherwise normal on direct                            and retroflexion views.                           - No specimens collected. Recommendation:           - Repeat colonoscopy in 10 years for screening                            purposes.                           - Resume previous diet.                           - Continue present medications. Iva Boop, MD 05/26/2020 10:58:17 AM This report has been signed electronically.

## 2020-05-26 NOTE — Patient Instructions (Addendum)
No polyps or cancer were seen.  You do have diverticulosis - thickened muscle rings and pouches in the colon wall. Please read the handout about this condition.  Next routine colonoscopy or other screening test in 10 years - 2032.  I appreciate the opportunity to care for you. Iva Boop, MD, [facility]   Discharge instructions given. Handout on Diverticulosis. Resume previous medications. YOU HAD AN ENDOSCOPIC PROCEDURE TODAY AT THE [facility]:   Refer to the procedure report that was given to you for any specific questions about what was found during the examination.  If the procedure report does not answer your questions, please call your gastroenterologist to clarify.  If you requested that your care partner not be given the details of your procedure findings, then the procedure report has been included in a sealed envelope for you to review at your convenience later.  YOU SHOULD EXPECT: Some feelings of bloating in the abdomen. Passage of more gas than usual.  Walking can help get rid of the air that was put into your GI tract during the procedure and reduce the bloating. If you had a lower endoscopy (such as a colonoscopy or flexible sigmoidoscopy) you may notice spotting of blood in your stool or on the toilet paper. If you underwent a bowel prep for your procedure, you may not have a normal bowel movement for a few days.  Please Note:  You might notice some irritation and congestion in your nose or some drainage.  This is from the oxygen used during your procedure.  There is no need for concern and it should clear up in a day or so.  SYMPTOMS TO REPORT IMMEDIATELY:   Following lower endoscopy (colonoscopy or flexible sigmoidoscopy):  Excessive amounts of blood in the stool  Significant tenderness or worsening of abdominal pains  Swelling of the abdomen that is new, acute  Fever of 100F or higher   For urgent or emergent issues, a gastroenterologist can be reached at  any hour by calling [phone]. Do not use MyChart messaging for urgent concerns.    DIET:  We do recommend a small meal at first, but then you may proceed to your regular diet.  Drink plenty of fluids but you should avoid alcoholic beverages for 24 hours.  ACTIVITY:  You should plan to take it easy for the rest of today and you should NOT DRIVE or use heavy machinery until tomorrow (because of the sedation medicines used during the test).    FOLLOW UP: Our staff will call the number listed on your records 48-72 hours following your procedure to check on you and address any questions or concerns that you may have regarding the information given to you following your procedure. If we do not reach you, we will leave a message.  We will attempt to reach you two times.  During this call, we will ask if you have developed any symptoms of COVID 19. If you develop any symptoms (ie: fever, flu-like symptoms, shortness of breath, cough etc.) before then, please call [phone].  If you test positive for Covid 19 in the 2 weeks post procedure, please call and report this information to [place].    If any biopsies were taken you will be contacted by phone or by letter within the next 1-3 weeks.  Please call us at [phone] if you have not heard about the biopsies in 3 weeks.    SIGNATURES/CONFIDENTIALITY: You and/or your care partner have signed paperwork  which will be entered into your electronic medical record.  These signatures attest to the fact that that the information above on your After Visit Summary has been reviewed and is understood.  Full responsibility of the confidentiality of this discharge information lies with you and/or your care-partner.

## 2020-05-28 ENCOUNTER — Telehealth: Payer: Self-pay

## 2020-05-28 NOTE — Telephone Encounter (Signed)
  Follow up Call-  Call back number 05/26/2020  Post procedure Call Back phone  # [phone]  Permission to leave phone message Yes  Some recent data might be hidden     Patient questions:  Do you have a fever, pain , or abdominal swelling? No. Pain Score  0 *  Have you tolerated food without any problems? Yes.    Have you been able to return to your normal activities? Yes.    Do you have any questions about your discharge instructions: Diet   No. Medications  No. Follow up visit  No.  Do you have questions or concerns about your Care? No.  Actions: * If pain score is 4 or above: No action needed, pain <4.  1. Have you developed a fever since your procedure? no  2.   Have you had an respiratory symptoms (SOB or cough) since your procedure? no  3.   Have you tested positive for COVID 19 since your procedure no  4.   Have you had any family members/close contacts diagnosed with the COVID 19 since your procedure?  no   If yes to any of these questions please route to Laverna Peace, RN and Karlton Lemon, RN

## 2020-05-28 NOTE — Telephone Encounter (Signed)
NO ANSWER, MESSAGE LEFT FOR PATIENT. 

## 2020-06-10 ENCOUNTER — Other Ambulatory Visit: Payer: Self-pay

## 2020-06-10 ENCOUNTER — Ambulatory Visit
Admission: RE | Admit: 2020-06-10 | Discharge: 2020-06-10 | Disposition: A | Discharge status: Home / Self Care | Payer: Managed Care, Other (non HMO) | Source: Ambulatory Visit | Attending: Family Medicine | Admitting: Family Medicine

## 2020-06-10 DIAGNOSIS — R921 Mammographic calcification found on diagnostic imaging of breast: Secondary | ICD-10-CM

## 2020-06-11 NOTE — Progress Notes (Addendum)
[facility] [address], [address] [place], [place] [address] [phone]  Date:  06/14/2020   Name:  Lynn Smith   DOB:  May 28, 1970   MRN:  [id number]  PCP:  Pearline Cables, MD    Chief Complaint: Annual Exam   History of Present Illness:  Lynn Smith is a 50 y.o. very pleasant female patient who presents with the following:  Here today for a CPE History of prediabetes and dyslipidemia, HTN Last seen by myself about one year ago  She has done well in the interim.  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  We discussed and plan to start her back on 50 mg  She does try to exercise, never been a smoker  BP Readings from Last 3 Encounters:  06/14/20 134/86  05/26/20 (!) 149/95  02/13/20 (!) 145/88       Patient Active Problem List   Diagnosis Date Noted  . Pre-diabetes 06/02/2019  . Dyslipidemia 06/02/2019    Past Medical History:  Diagnosis Date  . Allergy   . History of C-section   . Hypertension     Past Surgical History:  Procedure Laterality Date  . CESAREAN SECTION    . LEEP  1998    Social History   Tobacco Use  . Smoking status: Never Smoker  . Smokeless tobacco: Never Used  Substance Use Topics  . Alcohol use: No   . Drug use: No    Family History  Problem Relation Age of Onset  . Hypertension Mother   . Cancer Mother        uterus  . Hypertension Father   . Cancer Father        colon and lung  . Colon cancer Father   . Hypertension Sister   . Esophageal cancer Neg Hx   . Stomach cancer Neg Hx   . Rectal cancer Neg Hx     No Known Allergies  Medication list has been reviewed and updated.  Current Outpatient Medications on File Prior to Visit  Medication Sig Dispense Refill  . ascorbic acid (VITAMIN C) 1000 MG tablet Take 1,000 mg by mouth daily.    Claris Gower Grape-Goldenseal (BERBERINE COMPLEX PO) Take 1 tablet by mouth daily.    . Cholecalciferol (VITAMIN D3 PO) Take by mouth.    . Chromium 500 MCG TABS Take 500 mcg by mouth daily.    Marland Kitchen ELDERBERRY PO Take 1 capsule by mouth daily.    . fluticasone (FLONASE) 50 MCG/ACT nasal spray Place 2 sprays into both nostrils daily. (Patient not taking: Reported on 05/26/2020) 16 g 1  . Lactobacillus (  ACIDOPHILUS PO) Take by mouth.    . levocetirizine (XYZAL) 5 MG tablet Take 1 tablet (5 mg total) by mouth every evening. 30 tablet 3  . meclizine (ANTIVERT) 25 MG tablet Take 1 tablet (25 mg total) by mouth 3 (three) times daily as needed for up to 30 doses for dizziness. (Patient not taking: Reported on 05/26/2020) 30 tablet 0  . Multiple Vitamin (MULTIVITAMIN) tablet Take 1 tablet by mouth daily.    Marland Kitchen PHYTOSTEROLS PO Take 1 capsule by mouth daily.     . Polyethylene Glycol 3350 (MIRALAX PO) Take 238 g by mouth. Colonoscopy prep    . Turmeric 500 MG CAPS Take 500 mg by mouth daily.    Marland Kitchen zinc gluconate 50 MG tablet Take 50 mg by mouth daily.     No current facility-administered medications on file prior to visit.    Review of Systems:  As per HPI- otherwise negative.   Physical Examination: Vitals:   06/14/20 1123 06/14/20 1214  BP: 134/86   Pulse: (!) 116 90  Resp: 16   SpO2: 98%    Vitals:   06/14/20 1123  Weight: 207 lb (93.9  kg)  Height: 5\' 8"  (1.727 m)   Body mass index is 31.47 kg/m. Ideal Body Weight: Weight in (lb) to have BMI = 25: 164.1  GEN: no acute distress.  Overweight, otherwise looks well   HEENT: Atraumatic, Normocephalic.   Bilateral TM wnl, oropharynx normal.  PEERL,EOMI.   Ears and Nose: No external deformity. CV: RRR, No M/G/R. No JVD. No thrill. No extra heart sounds. PULM: CTA B, no wheezes, crackles, rhonchi. No retractions. No resp. distress. No accessory muscle use. ABD: S, NT, ND, +BS. No rebound. No HSM. EXTR: No c/c/e PSYCH: Normally interactive. Conversant. '  Assessment and Plan: Physical exam  Hypertension, unspecified type - Plan: CBC, Comprehensive metabolic panel  Screening for deficiency anemia - Plan: CBC  Screening for diabetes mellitus - Plan: Comprehensive metabolic panel, Hemoglobin A1c  Screening for thyroid disorder - Plan: TSH  Screening for hyperlipidemia - Plan: Lipid panel  Fatigue, unspecified type - Plan: TSH, VITAMIN D 25 Hydroxy (Vit-D Deficiency, Fractures)  Encounter for hepatitis C screening test for low risk patient - Plan: Hepatitis C antibody, HIV Antibody (routine testing w rflx)  Screening for HIV (human immunodeficiency virus)  Essential hypertension - Plan: losartan (COZAAR) 50 MG tablet  Immunization due - Plan: Tdap vaccine greater than or equal to 7yo IM  Patient today for a physical exam Health maintenance discussed as above, encouraged healthy diet and exercise routine Update Tdap Encouraged COVID-19 booster Discussed perimenopause.  Explained to patient that menstrual irregularity is normal at this time.  However, if she continues to have heavy bleeding or clotting please let me know, if this persist she may need endometrial evaluation.  Will decrease losartan to 50 mg as her blood pressure is reasonable off medication for 10 days  Will plan further follow- up pending labs.   This visit occurred during the SARS-CoV-2 public  health emergency.  Safety protocols were in place, including screening questions prior to the visit, additional usage of staff PPE, and extensive cleaning of exam room while observing appropriate contact time as indicated for disinfecting solutions.     Signed , MD  Received her labs 2/22- message to pt   Results for orders placed or performed in visit on 06/14/20  CBC  Result Value Ref Range   WBC 4.9 4.0 - 10.5 K/uL  RBC 4.00 3.87 - 5.11 Mil/uL   Platelets 391.0 150.0 - 400.0 K/uL   Hemoglobin 11.5 (L) 12.0 - 15.0 g/dL   HCT 81.8 (L) 29.9 - 37.1 %   MCV 87.8 78.0 - 100.0 fl   MCHC 32.7 30.0 - 36.0 g/dL   RDW 69.6 78.9 - 38.1 %  Comprehensive metabolic panel  Result Value Ref Range   Sodium 137 135 - 145 mEq/L   Potassium 4.4 3.5 - 5.1 mEq/L   Chloride 105 96 - 112 mEq/L   CO2 25 19 - 32 mEq/L   Glucose, Bld 92 70 - 99 mg/dL   BUN 11 6 - 23 mg/dL   Creatinine, Ser 0.17 0.40 - 1.20 mg/dL   Total Bilirubin 0.4 0.2 - 1.2 mg/dL   Alkaline Phosphatase 61 39 - 117 U/L   AST 12 0 - 37 U/L   ALT 8 0 - 35 U/L   Total Protein 7.1 6.0 - 8.3 g/dL   Albumin 3.8 3.5 - 5.2 g/dL   GFR 51.02 >58.52 mL/min   Calcium 9.1 8.4 - 10.5 mg/dL  Hemoglobin D7O  Result Value Ref Range   Hgb A1c MFr Bld 5.8 4.6 - 6.5 %  Lipid panel  Result Value Ref Range   Cholesterol 235 (H) 0 - 200 mg/dL   Triglycerides 24.2 0.0 - 149.0 mg/dL   HDL 35.36 >14.43 mg/dL   VLDL 15.4 0.0 - 00.8 mg/dL   LDL Cholesterol 676 (H) 0 - 99 mg/dL   Total CHOL/HDL Ratio 3    NonHDL 161.51   TSH  Result Value Ref Range   TSH 1.27 0.35 - 4.50 uIU/mL  VITAMIN D 25 Hydroxy (Vit-D Deficiency, Fractures)  Result Value Ref Range   VITD 43.71 30.00 - 100.00 ng/mL

## 2020-06-11 NOTE — Patient Instructions (Addendum)
Good to see you again today- I will be in touch with your labs asap We will put you back on losartan at 50 mg daily for blood pressure Please let me know if you continue to have heavy menses- some irregularity is normal as you enter "perimenopause" Please get your covid booster asap- before your trip! Tetanus booster today    Health Maintenance, Female Adopting a healthy lifestyle and getting preventive care are important in promoting health and wellness. Ask your health care provider about:  The right schedule for you to have regular tests and exams.  Things you can do on your own to prevent diseases and keep yourself healthy. What should I know about diet, weight, and exercise? Eat a healthy diet  Eat a diet that includes plenty of vegetables, fruits, low-fat dairy products, and lean protein.  Do not eat a lot of foods that are high in solid fats, added sugars, or sodium.   Maintain a healthy weight Body mass index (BMI) is used to identify weight problems. It estimates body fat based on height and weight. Your health care provider can help determine your BMI and help you achieve or maintain a healthy weight. Get regular exercise Get regular exercise. This is one of the most important things you can do for your health. Most adults should:  Exercise for at least 150 minutes each week. The exercise should increase your heart rate and make you sweat (moderate-intensity exercise).  Do strengthening exercises at least twice a week. This is in addition to the moderate-intensity exercise.  Spend less time sitting. Even light physical activity can be beneficial. Watch cholesterol and blood lipids Have your blood tested for lipids and cholesterol at 51 years of age, then have this test every 5 years. Have your cholesterol levels checked more often if:  Your lipid or cholesterol levels are high.  You are older than 51 years of age.  You are at high risk for heart disease. What should I  know about cancer screening? Depending on your health history and family history, you may need to have cancer screening at various ages. This may include screening for:  Breast cancer.  Cervical cancer.  Colorectal cancer.  Skin cancer.  Lung cancer. What should I know about heart disease, diabetes, and high blood pressure? Blood pressure and heart disease  High blood pressure causes heart disease and increases the risk of stroke. This is more likely to develop in people who have high blood pressure readings, are of African descent, or are overweight.  Have your blood pressure checked: ? Every 3-5 years if you are 40-22 years of age. ? Every year if you are 29 years old or older. Diabetes Have regular diabetes screenings. This checks your fasting blood sugar level. Have the screening done:  Once every three years after age 63 if you are at a normal weight and have a low risk for diabetes.  More often and at a younger age if you are overweight or have a high risk for diabetes. What should I know about preventing infection? Hepatitis B If you have a higher risk for hepatitis B, you should be screened for this virus. Talk with your health care provider to find out if you are at risk for hepatitis B infection. Hepatitis C Testing is recommended for:  Everyone born from [birth year] through 1966.  Anyone with known risk factors for hepatitis C. Sexually transmitted infections (STIs)  Get screened for STIs, including gonorrhea and chlamydia, if: ? You  are sexually active and are younger than 51 years of age. ? You are older than 51 years of age and your health care provider tells you that you are at risk for this type of infection. ? Your sexual activity has changed since you were last screened, and you are at increased risk for chlamydia or gonorrhea. Ask your health care provider if you are at risk.  Ask your health care provider about whether you are at high risk for HIV. Your health  care provider may recommend a prescription medicine to help prevent HIV infection. If you choose to take medicine to prevent HIV, you should first get tested for HIV. You should then be tested every 3 months for as [place] as you are taking the medicine. Pregnancy  If you are about to stop having your period (premenopausal) and you may become pregnant, seek counseling before you get pregnant.  Take 400 to 800 micrograms (mcg) of folic acid every day if you become pregnant.  Ask for birth control (contraception) if you want to prevent pregnancy. Osteoporosis and menopause Osteoporosis is a disease in which the bones lose minerals and strength with aging. This can result in bone fractures. If you are 63 years old or older, or if you are at risk for osteoporosis and fractures, ask your health care provider if you should:  Be screened for bone loss.  Take a calcium or vitamin D supplement to lower your risk of fractures.  Be given hormone replacement therapy (HRT) to treat symptoms of menopause. Follow these instructions at home: Lifestyle  Do not use any products that contain nicotine or tobacco, such as cigarettes, e-cigarettes, and chewing tobacco. If you need help quitting, ask your health care provider.  Do not use street drugs.  Do not share needles.  Ask your health care provider for help if you need support or information about quitting drugs. Alcohol use  Do not drink alcohol if: ? Your health care provider tells you not to drink. ? You are pregnant, may be pregnant, or are planning to become pregnant.  If you drink alcohol: ? Limit how much you use to 0-1 drink a day. ? Limit intake if you are breastfeeding.  Be aware of how much alcohol is in your drink. In the [place] one drink equals one 12 oz bottle of beer (355 mL), one 5 oz glass of wine (148 mL), or one 1 oz glass of hard liquor (44 mL). General instructions  Schedule regular health, dental, and eye exams.  Stay  current with your vaccines.  Tell your health care provider if: ? You often feel depressed. ? You have ever been abused or do not feel safe at home. Summary  Adopting a healthy lifestyle and getting preventive care are important in promoting health and wellness.  Follow your health care provider's instructions about healthy diet, exercising, and getting tested or screened for diseases.  Follow your health care provider's instructions on monitoring your cholesterol and blood pressure. This information is not intended to replace advice given to you by your health care provider. Make sure you discuss any questions you have with your health care provider. Document Revised: 04/03/2018 Document Reviewed: 04/03/2018 Elsevier Patient Education  2021 [employer].

## 2020-06-14 ENCOUNTER — Ambulatory Visit (INDEPENDENT_AMBULATORY_CARE_PROVIDER_SITE_OTHER): Payer: Managed Care, Other (non HMO) | Admitting: Family Medicine

## 2020-06-14 ENCOUNTER — Other Ambulatory Visit (HOSPITAL_COMMUNITY): Payer: Self-pay | Admitting: Internal Medicine

## 2020-06-14 ENCOUNTER — Other Ambulatory Visit: Payer: Self-pay

## 2020-06-14 ENCOUNTER — Encounter: Payer: Self-pay | Admitting: Family Medicine

## 2020-06-14 ENCOUNTER — Ambulatory Visit: Payer: Managed Care, Other (non HMO) | Attending: Internal Medicine

## 2020-06-14 VITALS — BP 134/86 | HR 90 | Resp 16 | Ht 68.0 in | Wt 207.0 lb

## 2020-06-14 DIAGNOSIS — Z13220 Encounter for screening for lipoid disorders: Secondary | ICD-10-CM | POA: Diagnosis not present

## 2020-06-14 DIAGNOSIS — Z131 Encounter for screening for diabetes mellitus: Secondary | ICD-10-CM

## 2020-06-14 DIAGNOSIS — R5383 Other fatigue: Secondary | ICD-10-CM | POA: Diagnosis not present

## 2020-06-14 DIAGNOSIS — Z0000 Encounter for general adult medical examination without abnormal findings: Secondary | ICD-10-CM

## 2020-06-14 DIAGNOSIS — Z23 Encounter for immunization: Secondary | ICD-10-CM

## 2020-06-14 DIAGNOSIS — I10 Essential (primary) hypertension: Secondary | ICD-10-CM

## 2020-06-14 DIAGNOSIS — Z130 Encounter for screening for diseases of the blood and blood-forming organs and certain disorders involving the immune mechanism: Secondary | ICD-10-CM

## 2020-06-14 DIAGNOSIS — Z1329 Encounter for screening for other suspected endocrine disorder: Secondary | ICD-10-CM | POA: Diagnosis not present

## 2020-06-14 DIAGNOSIS — Z114 Encounter for screening for human immunodeficiency virus [HIV]: Secondary | ICD-10-CM

## 2020-06-14 DIAGNOSIS — Z1159 Encounter for screening for other viral diseases: Secondary | ICD-10-CM

## 2020-06-14 LAB — CBC
HCT: 35.1 % — ABNORMAL LOW (ref 36.0–46.0)
Hemoglobin: 11.5 g/dL — ABNORMAL LOW (ref 12.0–15.0)
MCHC: 32.7 g/dL (ref 30.0–36.0)
MCV: 87.8 fl (ref 78.0–100.0)
Platelets: 391 10*3/uL (ref 150.0–400.0)
RBC: 4 Mil/uL (ref 3.87–5.11)
RDW: 13.2 % (ref 11.5–15.5)
WBC: 4.9 10*3/uL (ref 4.0–10.5)

## 2020-06-14 LAB — LIPID PANEL
Cholesterol: 235 mg/dL — ABNORMAL HIGH (ref 0–200)
HDL: 73.5 mg/dL (ref >39.00)
LDL Cholesterol: 146 mg/dL — ABNORMAL HIGH (ref 0–99)
NonHDL: 161.51
Total CHOL/HDL Ratio: 3
Triglycerides: 77 mg/dL (ref 0.0–149.0)
VLDL: 15.4 mg/dL (ref 0.0–40.0)

## 2020-06-14 LAB — COMPREHENSIVE METABOLIC PANEL
ALT: 8 U/L (ref 0–35)
AST: 12 U/L (ref 0–37)
Albumin: 3.8 g/dL (ref 3.5–5.2)
Alkaline Phosphatase: 61 U/L (ref 39–117)
BUN: 11 mg/dL (ref 6–23)
CO2: 25 mEq/L (ref 19–32)
Calcium: 9.1 mg/dL (ref 8.4–10.5)
Chloride: 105 mEq/L (ref 96–112)
Creatinine, Ser: 0.74 mg/dL (ref 0.40–1.20)
GFR: 93.92 mL/min (ref >60.00)
Glucose, Bld: 92 mg/dL (ref 70–99)
Potassium: 4.4 mEq/L (ref 3.5–5.1)
Sodium: 137 mEq/L (ref 135–145)
Total Bilirubin: 0.4 mg/dL (ref 0.2–1.2)
Total Protein: 7.1 g/dL (ref 6.0–8.3)

## 2020-06-14 LAB — VITAMIN D 25 HYDROXY (VIT D DEFICIENCY, FRACTURES): VITD: 43.71 ng/mL (ref 30.00–100.00)

## 2020-06-14 LAB — HEMOGLOBIN A1C: Hgb A1c MFr Bld: 5.8 % (ref 4.6–6.5)

## 2020-06-14 LAB — TSH: TSH: 1.27 u[IU]/mL (ref 0.35–4.50)

## 2020-06-14 MED ORDER — LOSARTAN POTASSIUM 50 MG PO TABS: 50.0000 mg | ORAL_TABLET | Freq: Every day | ORAL | 3 refills | Status: DC

## 2020-06-14 NOTE — Progress Notes (Signed)
   Covid-19 Vaccination Clinic  Name:  Lynn Smith    MRN: [id number] DOB: [birth year]  06/14/2020  [name] was observed post Covid-19 immunization for 15 minutes without incident. She was provided with Vaccine Information Sheet and instruction to access the V-Safe system.   [name] was instructed to call 911 with any severe reactions post vaccine: Marland Kitchen Difficulty breathing  . Swelling of face and throat  . A fast heartbeat  . A bad rash all over body  . Dizziness and weakness   Immunizations Administered    Name Date Dose VIS Date Route   PFIZER Comrnaty(Gray TOP) Covid-19 Vaccine 06/14/2020 12:04 PM 0.3 mL 04/01/2020 Intramuscular   Manufacturer: [employer], [employer]   Lot: [id number]   NDC: [phone]

## 2020-06-15 ENCOUNTER — Encounter: Payer: Self-pay | Admitting: Family Medicine

## 2020-06-15 MED FILL — PFIZER-BIONT COVID-19 VAC-T: 30 | 21 days supply | Qty: 0 | Fill #0

## 2020-06-16 LAB — HIV ANTIBODY (ROUTINE TESTING W REFLEX): HIV 1&2 Ab, 4th Generation: NONREACTIVE

## 2020-06-16 LAB — HEPATITIS C ANTIBODY
Hepatitis C Ab: NONREACTIVE
SIGNAL TO CUT-OFF: 0.01 (ref <1.00)

## 2020-11-19 ENCOUNTER — Other Ambulatory Visit (HOSPITAL_COMMUNITY): Payer: Self-pay

## 2021-04-05 ENCOUNTER — Other Ambulatory Visit (HOSPITAL_COMMUNITY): Payer: Self-pay

## 2021-05-18 DIAGNOSIS — S39012A Strain of muscle, fascia and tendon of lower back, initial encounter: Secondary | ICD-10-CM | POA: Diagnosis not present

## 2021-07-29 ENCOUNTER — Other Ambulatory Visit: Payer: Self-pay | Admitting: Family Medicine

## 2021-07-29 DIAGNOSIS — I10 Essential (primary) hypertension: Secondary | ICD-10-CM

## 2021-08-12 NOTE — Patient Instructions (Addendum)
Good to see you again today!  I will be in touch with your pap and labs ?Consider getting shingles vaccine series at your convenience ?Referral to GYN for heavy menstrual bleeding- DR Renaldo Fiddler, there are options to help get your bleeding under control  ?Try taking 1.5 tabs of losartan (75mg ) ?BP goal is 120- 135/ 70- 85  let me know how you do!   ?

## 2021-08-12 NOTE — Progress Notes (Addendum)
[facility] ?[address], [address] ?[place], [place] [address] ?336 [phone] ?Fax [phone] ? ?Date:  08/15/2021  ? ?Name:  Lynn ChapelStacy Smith   DOB:  October 11, 1970   MRN:  [id number] ? ?PCP:  Pearline Cablesopland, Emanual Lamountain C, MD  ? ? ?Chief Complaint: Annual Exam (Concerns/ questions: Pt wonders if her Losartan is needed. Kathreen Cornfield/Zoster: None in ncir) ? ? ?History of Present Illness: ? ?Lynn ChapelStacy Smith is a 51 y.o. very pleasant female patient who presents with the following: ? ?Here today for a CPE ?Last seen by myself about 15 months ago  ?History of prediabetes and dyslipidemia, HTN ? 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 ?Her son is 52 yo - he is starting drivers ed today!  He is her one and only ? ?Shingrix- mentioned to her today, she would like to defer ?Covid booster -suggested ?Colon done 2/22 ?Mammo can be updated- order for her ?Labs are due - update today  ?Losartan 50- she has been out for a week or so but did take it last night ?She does note her BP may be 140/89 when she checks it at home.  She was concerned that it is too high at home.  No side effects noted from losartan ? ?Pap normal 2 years ago - will update today due to change in bleeding pattern  ? ?She had menses in September- nothing until February.  Then bleeding returned in February, heavy!  She has noted increased intermenstrual bleeding for about a year. ?Previously she had been a [facility]chen [facility], I had taken over her Pap screening and mammograms.  She would like to see Dr. Renaldo FiddlerAdkins again for follow-up of menorrhagia ? ? ?Patient Active Problem List  ? Diagnosis Date Noted  ? Pre-diabetes 06/02/2019  ? Dyslipidemia 06/02/2019  ? ? ?Past Medical History:  ?Diagnosis Date  ? Allergy   ? History of C-section   ? Hypertension   ? ? ?Past Surgical History:  ?Procedure  Laterality Date  ? CESAREAN SECTION    ? LEEP  1998  ? ? ?Social History  ? ?Tobacco Use  ? Smoking status: Never  ? Smokeless tobacco: Never  ?Substance Use Topics  ? Alcohol use: No  ? Drug use: No  ? ? ?Family History  ?Problem Relation Age of Onset  ? Hypertension Mother   ? Cancer Mother   ?     uterus  ? Hypertension Father   ? Cancer Father   ?     colon and lung  ? Colon cancer Father   ? Hypertension Sister   ? Esophageal cancer Neg Hx   ? Stomach cancer Neg Hx   ? Rectal cancer Neg Hx   ? ? ?No Known Allergies ? ?Medication list has been reviewed and updated. ? ?Current Outpatient Medications on File Prior to Visit  ?Medication Sig Dispense Refill  ? ascorbic acid (VITAMIN C) 1000 MG tablet Take 1,000 mg by mouth daily.    ? Barberry-Oreg Grape-Goldenseal (BERBERINE COMPLEX PO) Take 1 tablet by mouth daily.    ? Cholecalciferol (VITAMIN D3 PO) Take by mouth.    ? Chromium 500 MCG TABS Take 500 mcg by mouth daily.    ? ELDERBERRY PO Take 1 capsule by mouth daily.    ?  fluticasone (FLONASE) 50 MCG/ACT nasal spray Place 2 sprays into both nostrils daily. 16 g 1  ? Lactobacillus (ACIDOPHILUS PO) Take by mouth.    ? levocetirizine (XYZAL) 5 MG tablet Take 1 tablet (5 mg total) by mouth every evening. 30 tablet 3  ? losartan (COZAAR) 50 MG tablet Take 1 tablet (50 mg total) by mouth daily. 90 tablet 3  ? meclizine (ANTIVERT) 25 MG tablet Take 1 tablet (25 mg total) by mouth 3 (three) times daily as needed for up to 30 doses for dizziness. 30 tablet 0  ? Multiple Vitamin (MULTIVITAMIN) tablet Take 1 tablet by mouth daily.    ? PHYTOSTEROLS PO Take 1 capsule by mouth daily.     ? Polyethylene Glycol 3350 (MIRALAX PO) Take 238 g by mouth. Colonoscopy prep    ? Turmeric 500 MG CAPS Take 500 mg by mouth daily.    ? zinc gluconate 50 MG tablet Take 50 mg by mouth daily.    ? ?No current facility-administered medications on file prior to visit.  ? ? ?Review of Systems: ? ?As per HPI- otherwise negative. ? ? ?Physical  Examination: ?Vitals:  ? 08/15/21 0947  ?BP: 122/74  ?Pulse: 84  ?Resp: 18  ?Temp: 98.4 ?F (36.9 ?C)  ?SpO2: 98%  ? ?Vitals:  ? 08/15/21 0947  ?Weight: 205 lb (93 kg)  ?Height: 5\' 8"  (1.727 m)  ? ?Body mass index is 31.17 kg/m?. ?Ideal Body Weight: Weight in (lb) to have BMI = 25: 164.1 ? ?GEN: no acute distress.  Mildly obese, looks well  ?HEENT: Atraumatic, Normocephalic.  ?Ears and Nose: No external deformity. ?CV: RRR, No M/G/R. No JVD. No thrill. No extra heart sounds. ?PULM: CTA B, no wheezes, crackles, rhonchi. No retractions. No resp. distress. No accessory muscle use. ?ABD: S, NT, ND, +BS. No rebound. No HSM. ?EXTR: No c/c/e ?PSYCH: Normally interactive. Conversant.  ?Pelvic exam: Normal external genitals, vagina and cervix.  Normal bimanual exam ? ?Assessment and Plan: ?Physical exam ? ?Hypertension, unspecified type - Plan: CBC, Comprehensive metabolic panel ? ?Screening for deficiency anemia - Plan: CBC ? ?Screening for diabetes mellitus - Plan: Comprehensive metabolic panel, Hemoglobin A1c ? ?Screening for hyperlipidemia - Plan: Lipid panel ? ?Screening for thyroid disorder - Plan: TSH ? ?Essential hypertension - Plan: losartan (COZAAR) 50 MG tablet ? ?Perimenopause - Plan: FSH ? ?Encounter for screening mammogram for malignant neoplasm of breast - Plan: MM 3D SCREEN BREAST BILATERAL ? ?Excessive bleeding in premenopausal period - Plan: Ferritin, Ambulatory referral to Obstetrics / Gynecology ? ?Screening for cervical cancer - Plan: Cytology - PAP ? ?Physical exam- encouraged healthy diet and exercise routine ?Patient has noted mildly increased blood pressure at home.  We will have her try taking up to 75 mg of losartan.  She will let me know if this does not bring her blood pressure into desired parameters ?Likely perimenopause with menorrhagia.  Pap updated, check ferritin FSH.  Referral back to GYN for further evaluation and endometrium and possible treatment for menorrhagia ?Ordered  mammogram ?Signed ? , MD ? ?Received labs as below, message to patient ? ?Results for orders placed or performed in visit on 08/15/21  ?CBC  ?Result Value Ref Range  ? WBC 5.3 4.0 - 10.5 K/uL  ? RBC 3.72 (L) 3.87 - 5.11 Mil/uL  ? Platelets 447.0 (H) 150.0 - 400.0 K/uL  ? Hemoglobin 9.3 (L) 12.0 - 15.0 g/dL  ? HCT 28.7 (L) 36.0 - 46.0 %  ? MCV 77.0 (L)  78.0 - 100.0 fl  ? MCHC 32.5 30.0 - 36.0 g/dL  ? RDW 17.9 (H) 11.5 - 15.5 %  ?Comprehensive metabolic panel  ?Result Value Ref Range  ? Sodium 138 135 - 145 mEq/L  ? Potassium 4.4 3.5 - 5.1 mEq/L  ? Chloride 107 96 - 112 mEq/L  ? CO2 23 19 - 32 mEq/L  ? Glucose, Bld 100 (H) 70 - 99 mg/dL  ? BUN 10 6 - 23 mg/dL  ? Creatinine, Ser 0.79 0.40 - 1.20 mg/dL  ? Total Bilirubin 0.3 0.2 - 1.2 mg/dL  ? Alkaline Phosphatase 63 39 - 117 U/L  ? AST 12 0 - 37 U/L  ? ALT 8 0 - 35 U/L  ? Total Protein 7.1 6.0 - 8.3 g/dL  ? Albumin 3.9 3.5 - 5.2 g/dL  ? GFR 86.12 >60.00 mL/min  ? Calcium 8.8 8.4 - 10.5 mg/dL  ?Hemoglobin A1c  ?Result Value Ref Range  ? Hgb A1c MFr Bld 6.2 4.6 - 6.5 %  ?Lipid panel  ?Result Value Ref Range  ? Cholesterol 208 (H) 0 - 200 mg/dL  ? Triglycerides 66.0 0.0 - 149.0 mg/dL  ? HDL 61.40 >39.00 mg/dL  ? VLDL 13.2 0.0 - 40.0 mg/dL  ? LDL Cholesterol 133 (H) 0 - 99 mg/dL  ? Total CHOL/HDL Ratio 3   ? NonHDL 146.24   ?TSH  ?Result Value Ref Range  ? TSH 1.47 0.35 - 5.50 uIU/mL  ?FSH  ?Result Value Ref Range  ? FSH 4.5 mIU/ML  ?Ferritin  ?Result Value Ref Range  ? Ferritin 3.1 (L) 10.0 - 291.0 ng/mL  ? ? ? ?

## 2021-08-15 ENCOUNTER — Other Ambulatory Visit (HOSPITAL_COMMUNITY)
Admission: RE | Admit: 2021-08-15 | Discharge: 2021-08-15 | Disposition: A | Discharge status: Home / Self Care | Payer: BC Managed Care – PPO | Source: Ambulatory Visit | Attending: Family Medicine | Admitting: Family Medicine

## 2021-08-15 ENCOUNTER — Encounter: Payer: Self-pay | Admitting: Family Medicine

## 2021-08-15 ENCOUNTER — Ambulatory Visit (INDEPENDENT_AMBULATORY_CARE_PROVIDER_SITE_OTHER): Payer: BC Managed Care – PPO | Admitting: Family Medicine

## 2021-08-15 VITALS — BP 122/74 | HR 84 | Temp 98.4°F | Resp 18 | Ht 68.0 in | Wt 205.0 lb

## 2021-08-15 DIAGNOSIS — Z124 Encounter for screening for malignant neoplasm of cervix: Secondary | ICD-10-CM

## 2021-08-15 DIAGNOSIS — I10 Essential (primary) hypertension: Secondary | ICD-10-CM | POA: Diagnosis not present

## 2021-08-15 DIAGNOSIS — Z131 Encounter for screening for diabetes mellitus: Secondary | ICD-10-CM | POA: Diagnosis not present

## 2021-08-15 DIAGNOSIS — D500 Iron deficiency anemia secondary to blood loss (chronic): Secondary | ICD-10-CM

## 2021-08-15 DIAGNOSIS — Z130 Encounter for screening for diseases of the blood and blood-forming organs and certain disorders involving the immune mechanism: Secondary | ICD-10-CM | POA: Diagnosis not present

## 2021-08-15 DIAGNOSIS — Z1329 Encounter for screening for other suspected endocrine disorder: Secondary | ICD-10-CM | POA: Diagnosis not present

## 2021-08-15 DIAGNOSIS — N951 Menopausal and female climacteric states: Secondary | ICD-10-CM

## 2021-08-15 DIAGNOSIS — Z13220 Encounter for screening for lipoid disorders: Secondary | ICD-10-CM | POA: Diagnosis not present

## 2021-08-15 DIAGNOSIS — N924 Excessive bleeding in the premenopausal period: Secondary | ICD-10-CM

## 2021-08-15 DIAGNOSIS — Z1231 Encounter for screening mammogram for malignant neoplasm of breast: Secondary | ICD-10-CM

## 2021-08-15 DIAGNOSIS — Z0000 Encounter for general adult medical examination without abnormal findings: Secondary | ICD-10-CM | POA: Diagnosis not present

## 2021-08-15 LAB — LIPID PANEL
Cholesterol: 208 mg/dL — ABNORMAL HIGH (ref 0–200)
HDL: 61.4 mg/dL (ref >39.00)
LDL Cholesterol: 133 mg/dL — ABNORMAL HIGH (ref 0–99)
NonHDL: 146.24
Total CHOL/HDL Ratio: 3
Triglycerides: 66 mg/dL (ref 0.0–149.0)
VLDL: 13.2 mg/dL (ref 0.0–40.0)

## 2021-08-15 LAB — CBC
HCT: 28.7 % — ABNORMAL LOW (ref 36.0–46.0)
Hemoglobin: 9.3 g/dL — ABNORMAL LOW (ref 12.0–15.0)
MCHC: 32.5 g/dL (ref 30.0–36.0)
MCV: 77 fl — ABNORMAL LOW (ref 78.0–100.0)
Platelets: 447 10*3/uL — ABNORMAL HIGH (ref 150.0–400.0)
RBC: 3.72 Mil/uL — ABNORMAL LOW (ref 3.87–5.11)
RDW: 17.9 % — ABNORMAL HIGH (ref 11.5–15.5)
WBC: 5.3 10*3/uL (ref 4.0–10.5)

## 2021-08-15 LAB — COMPREHENSIVE METABOLIC PANEL
ALT: 8 U/L (ref 0–35)
AST: 12 U/L (ref 0–37)
Albumin: 3.9 g/dL (ref 3.5–5.2)
Alkaline Phosphatase: 63 U/L (ref 39–117)
BUN: 10 mg/dL (ref 6–23)
CO2: 23 mEq/L (ref 19–32)
Calcium: 8.8 mg/dL (ref 8.4–10.5)
Chloride: 107 mEq/L (ref 96–112)
Creatinine, Ser: 0.79 mg/dL (ref 0.40–1.20)
GFR: 86.12 mL/min (ref >60.00)
Glucose, Bld: 100 mg/dL — ABNORMAL HIGH (ref 70–99)
Potassium: 4.4 mEq/L (ref 3.5–5.1)
Sodium: 138 mEq/L (ref 135–145)
Total Bilirubin: 0.3 mg/dL (ref 0.2–1.2)
Total Protein: 7.1 g/dL (ref 6.0–8.3)

## 2021-08-15 LAB — HEMOGLOBIN A1C: Hgb A1c MFr Bld: 6.2 % (ref 4.6–6.5)

## 2021-08-15 LAB — TSH: TSH: 1.47 u[IU]/mL (ref 0.35–5.50)

## 2021-08-15 LAB — FOLLICLE STIMULATING HORMONE: FSH: 4.5 m[IU]/mL

## 2021-08-15 LAB — FERRITIN: Ferritin: 3.1 ng/mL — ABNORMAL LOW (ref 10.0–291.0)

## 2021-08-15 MED ORDER — LOSARTAN POTASSIUM 50 MG PO TABS: 75.0000 mg | ORAL_TABLET | Freq: Every day | ORAL | 3 refills | Status: DC

## 2021-08-16 ENCOUNTER — Ambulatory Visit (HOSPITAL_BASED_OUTPATIENT_CLINIC_OR_DEPARTMENT_OTHER)
Admission: RE | Admit: 2021-08-16 | Discharge: 2021-08-16 | Disposition: A | Discharge status: Home / Self Care | Payer: BC Managed Care – PPO | Source: Ambulatory Visit | Attending: Family Medicine | Admitting: Family Medicine

## 2021-08-16 ENCOUNTER — Encounter (HOSPITAL_BASED_OUTPATIENT_CLINIC_OR_DEPARTMENT_OTHER): Payer: Self-pay

## 2021-08-16 DIAGNOSIS — Z1231 Encounter for screening mammogram for malignant neoplasm of breast: Secondary | ICD-10-CM | POA: Diagnosis not present

## 2021-08-17 ENCOUNTER — Encounter: Payer: Self-pay | Admitting: Family Medicine

## 2021-08-17 LAB — CYTOLOGY - PAP
Adequacy: ABSENT
Comment: NEGATIVE
Diagnosis: NEGATIVE
High risk HPV: NEGATIVE

## 2021-08-19 ENCOUNTER — Inpatient Hospital Stay: Payer: BC Managed Care – PPO

## 2021-08-19 ENCOUNTER — Inpatient Hospital Stay: Payer: BC Managed Care – PPO | Admitting: Family

## 2021-08-19 ENCOUNTER — Other Ambulatory Visit: Payer: Self-pay | Admitting: Family

## 2021-08-19 DIAGNOSIS — D649 Anemia, unspecified: Secondary | ICD-10-CM

## 2021-08-25 NOTE — Telephone Encounter (Signed)
Schedule for may 22 @ 2pm ? ?Dr Kathryne Eriksson ?

## 2021-08-29 ENCOUNTER — Inpatient Hospital Stay: Payer: BC Managed Care – PPO | Attending: Hematology & Oncology

## 2021-08-29 ENCOUNTER — Inpatient Hospital Stay (HOSPITAL_BASED_OUTPATIENT_CLINIC_OR_DEPARTMENT_OTHER): Payer: BC Managed Care – PPO | Admitting: Family

## 2021-08-29 ENCOUNTER — Encounter: Payer: Self-pay | Admitting: Family

## 2021-08-29 DIAGNOSIS — I10 Essential (primary) hypertension: Secondary | ICD-10-CM | POA: Diagnosis not present

## 2021-08-29 DIAGNOSIS — D500 Iron deficiency anemia secondary to blood loss (chronic): Secondary | ICD-10-CM | POA: Diagnosis not present

## 2021-08-29 DIAGNOSIS — Z801 Family history of malignant neoplasm of trachea, bronchus and lung: Secondary | ICD-10-CM

## 2021-08-29 DIAGNOSIS — D649 Anemia, unspecified: Secondary | ICD-10-CM

## 2021-08-29 DIAGNOSIS — Z800 Family history of malignant neoplasm of digestive organs: Secondary | ICD-10-CM | POA: Insufficient documentation

## 2021-08-29 DIAGNOSIS — D509 Iron deficiency anemia, unspecified: Secondary | ICD-10-CM | POA: Insufficient documentation

## 2021-08-29 LAB — CBC WITH DIFFERENTIAL (CANCER CENTER ONLY)
Abs Immature Granulocytes: 0.01 10*3/uL (ref 0.00–0.07)
Basophils Absolute: 0.1 10*3/uL (ref 0.0–0.1)
Basophils Relative: 2 %
Eosinophils Absolute: 0.1 10*3/uL (ref 0.0–0.5)
Eosinophils Relative: 2 %
HCT: 27.6 % — ABNORMAL LOW (ref 36.0–46.0)
Hemoglobin: 8.5 g/dL — ABNORMAL LOW (ref 12.0–15.0)
Immature Granulocytes: 0 %
Lymphocytes Relative: 39 %
Lymphs Abs: 2 10*3/uL (ref 0.7–4.0)
MCH: 24.4 pg — ABNORMAL LOW (ref 26.0–34.0)
MCHC: 30.8 g/dL (ref 30.0–36.0)
MCV: 79.3 fL — ABNORMAL LOW (ref 80.0–100.0)
Monocytes Absolute: 0.5 10*3/uL (ref 0.1–1.0)
Monocytes Relative: 9 %
Neutro Abs: 2.5 10*3/uL (ref 1.7–7.7)
Neutrophils Relative %: 48 %
Platelet Count: 392 10*3/uL (ref 150–400)
RBC: 3.48 MIL/uL — ABNORMAL LOW (ref 3.87–5.11)
RDW: 16.5 % — ABNORMAL HIGH (ref 11.5–15.5)
WBC Count: 5.2 10*3/uL (ref 4.0–10.5)
nRBC: 0 % (ref 0.0–0.2)

## 2021-08-29 LAB — CMP (CANCER CENTER ONLY)
ALT: 8 U/L (ref 0–44)
AST: 12 U/L — ABNORMAL LOW (ref 15–41)
Albumin: 3.8 g/dL (ref 3.5–5.0)
Alkaline Phosphatase: 56 U/L (ref 38–126)
Anion gap: 4 — ABNORMAL LOW (ref 5–15)
BUN: 10 mg/dL (ref 6–20)
CO2: 26 mmol/L (ref 22–32)
Calcium: 9 mg/dL (ref 8.9–10.3)
Chloride: 107 mmol/L (ref 98–111)
Creatinine: 0.81 mg/dL (ref 0.44–1.00)
GFR, Estimated: >60 mL/min (ref >60)
Glucose, Bld: 101 mg/dL — ABNORMAL HIGH (ref 70–99)
Potassium: 4.1 mmol/L (ref 3.5–5.1)
Sodium: 137 mmol/L (ref 135–145)
Total Bilirubin: 0.3 mg/dL (ref 0.3–1.2)
Total Protein: 7 g/dL (ref 6.5–8.1)

## 2021-08-29 LAB — RETICULOCYTES
Immature Retic Fract: 28.6 % — ABNORMAL HIGH (ref 2.3–15.9)
RBC.: 3.49 MIL/uL — ABNORMAL LOW (ref 3.87–5.11)
Retic Count, Absolute: 59.3 10*3/uL (ref 19.0–186.0)
Retic Ct Pct: 1.7 % (ref 0.4–3.1)

## 2021-08-29 LAB — IRON AND IRON BINDING CAPACITY (CC-WL,HP ONLY)
Iron: 15 ug/dL — ABNORMAL LOW (ref 28–170)
Saturation Ratios: 3 % — ABNORMAL LOW (ref 10.4–31.8)
TIBC: 483 ug/dL — ABNORMAL HIGH (ref 250–450)
UIBC: 468 ug/dL — ABNORMAL HIGH (ref 148–442)

## 2021-08-29 LAB — SAVE SMEAR(SSMR), FOR PROVIDER SLIDE REVIEW

## 2021-08-29 LAB — LACTATE DEHYDROGENASE: LDH: 152 U/L (ref 98–192)

## 2021-08-29 LAB — FERRITIN: Ferritin: 3 ng/mL — ABNORMAL LOW (ref 11–307)

## 2021-08-29 NOTE — Progress Notes (Signed)
Hematology/Oncology Consultation  ? ?Name: Lynn Smith      MRN: [id number]    Location: Room/bed info not found  Date: 08/29/2021 Time:12:03 PM ? ? ?REFERRING PHYSICIAN:  Abbe Amsterdam, MD ? ?REASON FOR CONSULT: Iron deficiency anemia due to chronic blood loss ?  ?DIAGNOSIS: Iron deficiency anemia secondary to heavy cycles ? ?HISTORY OF PRESENT ILLNESS:  Lynn Smith is a very pleasant African American female with history of anemia secondary to heavy cycle.  ?She was recently found to have a Hgb of 9.3 and ferritin 3.  ?Hgb today is 8.5, MCV 79, platelets 392 and WBC count 5.2.  ?She is feeling fatigue at times and has also noted her lightheadedness with vertigo has been more frequent.  ?She states that her cycle did not occur from September 2022 until February 2023. She started again and has had very heavy cycles since.  ?No other blood loss noted.  ?She has been referred back to her previous gynecologist Dr. Glynda Jaeger for further evaluation and treatment.  ?She has one son. She carried him to term and he was delivered by C-section without any complications. No history of miscarriage.  ?Her mother and sisters also have history of anemia.  ?She states that her sister had uterine fibroids and required a hysterectomy due to the heavy bleeding and anemia.  ?She has never received IV iron in the past. She did not tolerate oral iron.  ?No history of diabetes or thyroid disease.  ?No fever, chills, n/v, cough, rash, dizziness, SOB, chest pain, palpitations, abdominal pain or changes in bowel or bladder habits.  ?No swelling, tenderness, numbness or tingling in her extremities.  ?No falls or syncope reported.  ?No smoking, ETOH or recreational drug use.  ?She has been eating well and is doing her best to stay well hydrated. Her weight is 207 lbs.  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  ?She was walking for exercise but has taken a break due to the fatigue.  ? ?ROS: All other 10 point review of systems is negative.   ? ?PAST MEDICAL HISTORY:   ?Past Medical History:  ?Diagnosis Date  ? Allergy   ? History of C-section   ? Hypertension   ? ? ?ALLERGIES: ?No Known Allergies ?   ?MEDICATIONS:  ?Current Outpatient Medications on File Prior to Visit  ?Medication Sig Dispense Refill  ? losartan (COZAAR) 50 MG tablet Take 1.5 tablets (75 mg total) by mouth daily. 135 tablet 3  ? ascorbic acid (VITAMIN C) 1000 MG tablet Take 1,000 mg by mouth daily. (Patient not taking: Reported on 08/29/2021)    ? Barberry-Oreg Grape-Goldenseal (BERBERINE COMPLEX PO) Take 1 tablet by mouth daily. (Patient not taking: Reported on 08/29/2021)    ? Cholecalciferol (VITAMIN D3 PO) Take by mouth. (Patient not taking: Reported on 08/29/2021)    ? Chromium 500 MCG TABS Take 500 mcg by mouth daily. (Patient not taking: Reported on 08/29/2021)    ? ELDERBERRY PO Take 1 capsule by mouth daily. (Patient not taking: Reported on 08/29/2021)    ? fluticasone (FLONASE) 50 MCG/ACT nasal spray Place 2 sprays into both nostrils daily. (Patient not taking: Reported on 08/29/2021) 16 g 1  ? Lactobacillus (ACIDOPHILUS PO) Take by mouth. (Patient not taking: Reported on 08/29/2021)    ? levocetirizine (XYZAL) 5 MG tablet Take 1 tablet (5 mg total) by mouth every evening. (Patient not taking: Reported on 08/29/2021) 30 tablet 3  ? meclizine (ANTIVERT) 25 MG tablet Take  1 tablet (25 mg total) by mouth 3 (three) times daily as needed for up to 30 doses for dizziness. (Patient not taking: Reported on 08/29/2021) 30 tablet 0  ? Multiple Vitamin (MULTIVITAMIN) tablet Take 1 tablet by mouth daily. (Patient not taking: Reported on 08/29/2021)    ? PHYTOSTEROLS PO Take 1 capsule by mouth daily.  (Patient not taking: Reported on 08/29/2021)    ? Polyethylene Glycol 3350 (MIRALAX PO) Take 238 g by mouth. Colonoscopy prep (Patient not taking: Reported on 08/29/2021)    ? Turmeric 500 MG CAPS Take 500 mg by mouth daily. (Patient not taking: Reported on 08/29/2021)    ? zinc gluconate 50 MG tablet Take 50 mg by  mouth daily. (Patient not taking: Reported on 08/29/2021)    ? ?No current facility-administered medications on file prior to visit.  ? ?  ?PAST SURGICAL HISTORY ?Past Surgical History:  ?Procedure Laterality Date  ? CESAREAN SECTION    ? LEEP  1998  ? ? ?FAMILY HISTORY: ?Family History  ?Problem Relation Age of Onset  ? Hypertension Mother   ? Cancer Mother   ?     uterus  ? Hypertension Father   ? Cancer Father   ?     colon and lung  ? Colon cancer Father   ? Hypertension Sister   ? Esophageal cancer Neg Hx   ? Stomach cancer Neg Hx   ? Rectal cancer Neg Hx   ? ? ?SOCIAL HISTORY: ? reports that she has never smoked. She has never used smokeless tobacco. She reports that she does not drink alcohol and does not use drugs. ? ?PERFORMANCE STATUS: ?The patient's performance status is 1 - Symptomatic but completely ambulatory ? ?PHYSICAL EXAM: ?Most Recent Vital Signs: Blood pressure (!) 144/93, pulse 92, temperature 98.6 ?F (37 ?C), temperature source Oral, resp. rate 17, height 5\' 9"  (1.753 m), weight 207 lb (93.9 kg), SpO2 100 %. ?BP (!) 144/93 (BP Location: Left Arm, Patient Position: Sitting)   Pulse 92   Temp 98.6 ?F (37 ?C) (Oral)   Resp 17   Ht 5\' 9"  (1.753 m)   Wt 207 lb (93.9 kg)   SpO2 100%   BMI 30.57 kg/m?  ? ?General Appearance:    Alert, cooperative, no distress, appears stated age  ?Head:    Normocephalic, without obvious abnormality, atraumatic  ?Eyes:    PERRL, conjunctiva/corneas clear, EOM's intact, fundi  ?  benign, both eyes  ?   ?   ?Throat:   Lips, mucosa, and tongue normal; teeth and gums normal  ?Neck:   Supple, symmetrical, trachea midline, no adenopathy;  ?  thyroid:  no enlargement/tenderness/nodules; no carotid ?  bruit or JVD  ?Back:     Symmetric, no curvature, ROM normal, no CVA tenderness  ?Lungs:     Clear to auscultation bilaterally, respirations unlabored  ?Chest Wall:    No tenderness or deformity  ? Heart:    Regular rate and rhythm, S1 and S2 normal, no murmur, rub   or  gallop  ?   ?Abdomen:     Soft, non-tender, bowel sounds active all four quadrants,  ?  no masses, no organomegaly  ?   ?   ?Extremities:   Extremities normal, atraumatic, no cyanosis or edema  ?Pulses:   2+ and symmetric all extremities  ?Skin:   Skin color, texture, turgor normal, no rashes or lesions  ?Lymph nodes:   Cervical, supraclavicular, and axillary nodes normal  ?Neurologic:  CNII-XII intact, normal strength, sensation and reflexes  ?  throughout  ? ? ?LABORATORY DATA:  ?Results for orders placed or performed in visit on 08/29/21 (from the past 48 hour(s))  ?CBC with Differential (Cancer Center Only)     Status: Abnormal  ? Collection Time: 08/29/21 10:34 AM  ?Result Value Ref Range  ? WBC Count 5.2 4.0 - 10.5 K/uL  ? RBC 3.48 (L) 3.87 - 5.11 MIL/uL  ? Hemoglobin 8.5 (L) 12.0 - 15.0 g/dL  ? HCT 27.6 (L) 36.0 - 46.0 %  ? MCV 79.3 (L) 80.0 - 100.0 fL  ? MCH 24.4 (L) 26.0 - 34.0 pg  ? MCHC 30.8 30.0 - 36.0 g/dL  ? RDW 16.5 (H) 11.5 - 15.5 %  ? Platelet Count 392 150 - 400 K/uL  ? nRBC 0.0 0.0 - 0.2 %  ? Neutrophils Relative % 48 %  ? Neutro Abs 2.5 1.7 - 7.7 K/uL  ? Lymphocytes Relative 39 %  ? Lymphs Abs 2.0 0.7 - 4.0 K/uL  ? Monocytes Relative 9 %  ? Monocytes Absolute 0.5 0.1 - 1.0 K/uL  ? Eosinophils Relative 2 %  ? Eosinophils Absolute 0.1 0.0 - 0.5 K/uL  ? Basophils Relative 2 %  ? Basophils Absolute 0.1 0.0 - 0.1 K/uL  ? Immature Granulocytes 0 %  ? Abs Immature Granulocytes 0.01 0.00 - 0.07 K/uL  ?  Comment: Performed at [facility] at [facility], [address] [place], [place] [address]  ?CMP (Cancer Center only)     Status: Abnormal  ? Collection Time: 08/29/21 10:34 AM  ?Result Value Ref Range  ? Sodium 137 135 - 145 mmol/L  ? Potassium 4.1 3.5 - 5.1 mmol/L  ? Chloride 107 98 - 111 mmol/L  ? CO2 26 22 - 32 mmol/L  ? Glucose, Bld 101 (H) 70 - 99 mg/dL  ?  Comment: Glucose reference range applies only to samples taken after fasting for at least 8 hours.  ? BUN 10 6 -  20 mg/dL  ? Creatinine 0.81 0.44 - 1.00 mg/dL  ? Calcium 9.0 8.9 - 10.3 mg/dL  ? Total Protein 7.0 6.5 - 8.1 g/dL  ? Albumin 3.8 3.5 - 5.0 g/dL  ? AST 12 (L) 15 - 41 U/L  ? ALT 8 0 - 44 U/L  ? Alkaline Phosphatas

## 2021-08-30 ENCOUNTER — Telehealth: Payer: Self-pay | Admitting: Family

## 2021-08-30 NOTE — Telephone Encounter (Signed)
Called to schedule per 5/8 los , left voicemail for patient to call back  ?

## 2021-08-31 LAB — ERYTHROPOIETIN: Erythropoietin: 84.5 m[IU]/mL — ABNORMAL HIGH (ref 2.6–18.5)

## 2021-09-01 ENCOUNTER — Inpatient Hospital Stay: Payer: BC Managed Care – PPO

## 2021-09-01 VITALS — BP 141/95 | HR 82 | Temp 98.5°F | Resp 18

## 2021-09-01 DIAGNOSIS — D500 Iron deficiency anemia secondary to blood loss (chronic): Secondary | ICD-10-CM

## 2021-09-01 DIAGNOSIS — Z800 Family history of malignant neoplasm of digestive organs: Secondary | ICD-10-CM | POA: Diagnosis not present

## 2021-09-01 DIAGNOSIS — I10 Essential (primary) hypertension: Secondary | ICD-10-CM | POA: Diagnosis not present

## 2021-09-01 DIAGNOSIS — Z801 Family history of malignant neoplasm of trachea, bronchus and lung: Secondary | ICD-10-CM | POA: Diagnosis not present

## 2021-09-01 MED ORDER — SODIUM CHLORIDE 0.9 % IV SOLN
125.0000 mg | Freq: Once | INTRAVENOUS | Status: AC
  Administered 2021-09-01: 125 mg via INTRAVENOUS
  Filled 2021-09-01: qty 125

## 2021-09-01 MED ORDER — SODIUM CHLORIDE 0.9 % IV SOLN: Freq: Once | INTRAVENOUS | Status: AC

## 2021-09-01 NOTE — Patient Instructions (Signed)
Sodium Ferric Gluconate Complex Injection What is this medication? SODIUM FERRIC GLUCONATE COMPLEX (SOE dee um FER ik GLOO koe nate KOM pleks) treats low levels of iron (iron deficiency anemia) in people with kidney disease. Iron is a mineral that plays an important role in making red blood cells, which carry oxygen from your lungs to the rest of your body. This medicine may be used for other purposes; ask your health care provider or pharmacist if you have questions. COMMON BRAND NAME(S): Ferrlecit, Nulecit What should I tell my care team before I take this medication? They need to know if you have any of the following conditions: Anemia that is not from iron deficiency High levels of iron in the blood An unusual or allergic reaction to iron, other medications, foods, dyes, or preservatives Pregnant or are trying to become pregnant Breast-feeding How should I use this medication? This medication is injected into a vein. It is given by your care team in a hospital or clinic setting. Talk to your care team about the use of this medication in children. While it may be prescribed for children as young as 6 years for selected conditions, precautions do apply. Overdosage: If you think you have taken too much of this medicine contact a poison control center or emergency room at once. NOTE: This medicine is only for you. Do not share this medicine with others. What if I miss a dose? It is important not to miss your dose. Call your care team if you are unable to keep an appointment. What may interact with this medication? Do not take this medication with any of the following: Deferasirox Deferoxamine Dimercaprol This medication may also interact with the following: Other iron products This list may not describe all possible interactions. Give your health care provider a list of all the medicines, herbs, non-prescription drugs, or dietary supplements you use. Also tell them if you smoke, drink  alcohol, or use illegal drugs. Some items may interact with your medicine. What should I watch for while using this medication? Your condition will be monitored carefully while you are receiving this medication. Visit your care team for regular checks on your progress. You may need blood work while you are taking this medication. What side effects may I notice from receiving this medication? Side effects that you should report to your care team as soon as possible: Allergic reactions--skin rash, itching, hives, swelling of the face, lips, tongue, or throat Low blood pressure--dizziness, feeling faint or lightheaded, blurry vision Shortness of breath Side effects that usually do not require medical attention (report to your care team if they continue or are bothersome): Flushing Headache Joint pain Muscle pain Nausea Pain, redness, or irritation at injection site This list may not describe all possible side effects. Call your doctor for medical advice about side effects. You may report side effects to FDA at 1-800-FDA-1088. Where should I keep my medication? This medication is given in a hospital or clinic and will not be stored at home. NOTE: This sheet is a summary. It may not cover all possible information. If you have questions about this medicine, talk to your doctor, pharmacist, or health care provider.  2023 Elsevier/Gold Standard (2020-09-03 00:00:00)  

## 2021-09-02 LAB — HGB FRACTIONATION CASCADE
Hgb A2: 2 % (ref 1.8–3.2)
Hgb A: 98 % (ref 96.4–98.8)
Hgb F: 0 % (ref 0.0–2.0)
Hgb S: 0 %

## 2021-09-07 LAB — ALPHA-THALASSEMIA GENOTYPR

## 2021-09-08 ENCOUNTER — Inpatient Hospital Stay: Payer: BC Managed Care – PPO

## 2021-09-08 VITALS — BP 142/85 | HR 79 | Temp 98.5°F | Resp 17

## 2021-09-08 DIAGNOSIS — D500 Iron deficiency anemia secondary to blood loss (chronic): Secondary | ICD-10-CM | POA: Diagnosis not present

## 2021-09-08 DIAGNOSIS — Z800 Family history of malignant neoplasm of digestive organs: Secondary | ICD-10-CM | POA: Diagnosis not present

## 2021-09-08 DIAGNOSIS — I10 Essential (primary) hypertension: Secondary | ICD-10-CM | POA: Diagnosis not present

## 2021-09-08 DIAGNOSIS — Z801 Family history of malignant neoplasm of trachea, bronchus and lung: Secondary | ICD-10-CM | POA: Diagnosis not present

## 2021-09-08 MED ORDER — SODIUM CHLORIDE 0.9 % IV SOLN
125.0000 mg | Freq: Once | INTRAVENOUS | Status: AC
  Administered 2021-09-08: 125 mg via INTRAVENOUS
  Filled 2021-09-08: qty 125

## 2021-09-08 MED ORDER — SODIUM CHLORIDE 0.9 % IV SOLN: Freq: Once | INTRAVENOUS | Status: AC

## 2021-09-08 NOTE — Patient Instructions (Signed)
Sodium Ferric Gluconate Complex Injection What is this medication? SODIUM FERRIC GLUCONATE COMPLEX (SOE dee um FER ik GLOO koe nate KOM pleks) treats low levels of iron (iron deficiency anemia) in people with kidney disease. Iron is a mineral that plays an important role in making red blood cells, which carry oxygen from your lungs to the rest of your body. This medicine may be used for other purposes; ask your health care provider or pharmacist if you have questions. COMMON BRAND NAME(S): Ferrlecit, Nulecit What should I tell my care team before I take this medication? They need to know if you have any of the following conditions: Anemia that is not from iron deficiency High levels of iron in the blood An unusual or allergic reaction to iron, other medications, foods, dyes, or preservatives Pregnant or are trying to become pregnant Breast-feeding How should I use this medication? This medication is injected into a vein. It is given by your care team in a hospital or clinic setting. Talk to your care team about the use of this medication in children. While it may be prescribed for children as young as 6 years for selected conditions, precautions do apply. Overdosage: If you think you have taken too much of this medicine contact a poison control center or emergency room at once. NOTE: This medicine is only for you. Do not share this medicine with others. What if I miss a dose? It is important not to miss your dose. Call your care team if you are unable to keep an appointment. What may interact with this medication? Do not take this medication with any of the following: Deferasirox Deferoxamine Dimercaprol This medication may also interact with the following: Other iron products This list may not describe all possible interactions. Give your health care provider a list of all the medicines, herbs, non-prescription drugs, or dietary supplements you use. Also tell them if you smoke, drink  alcohol, or use illegal drugs. Some items may interact with your medicine. What should I watch for while using this medication? Your condition will be monitored carefully while you are receiving this medication. Visit your care team for regular checks on your progress. You may need blood work while you are taking this medication. What side effects may I notice from receiving this medication? Side effects that you should report to your care team as soon as possible: Allergic reactions--skin rash, itching, hives, swelling of the face, lips, tongue, or throat Low blood pressure--dizziness, feeling faint or lightheaded, blurry vision Shortness of breath Side effects that usually do not require medical attention (report to your care team if they continue or are bothersome): Flushing Headache Joint pain Muscle pain Nausea Pain, redness, or irritation at injection site This list may not describe all possible side effects. Call your doctor for medical advice about side effects. You may report side effects to FDA at 1-800-FDA-1088. Where should I keep my medication? This medication is given in a hospital or clinic and will not be stored at home. NOTE: This sheet is a summary. It may not cover all possible information. If you have questions about this medicine, talk to your doctor, pharmacist, or health care provider.  2023 Elsevier/Gold Standard (2020-09-03 00:00:00)  

## 2021-09-08 NOTE — Progress Notes (Signed)
Pt declined to stay for post infusion observation period. Pt stated she has tolerated medication multiple times prior without difficulty. Pt aware to call clinic with any questions or concerns. Pt verbalized understanding and had no further questions.  ? ?

## 2021-09-12 DIAGNOSIS — N921 Excessive and frequent menstruation with irregular cycle: Secondary | ICD-10-CM | POA: Diagnosis not present

## 2021-09-29 DIAGNOSIS — N926 Irregular menstruation, unspecified: Secondary | ICD-10-CM | POA: Diagnosis not present

## 2021-09-29 DIAGNOSIS — N939 Abnormal uterine and vaginal bleeding, unspecified: Secondary | ICD-10-CM | POA: Diagnosis not present

## 2021-09-29 DIAGNOSIS — N920 Excessive and frequent menstruation with regular cycle: Secondary | ICD-10-CM | POA: Diagnosis not present

## 2021-09-29 DIAGNOSIS — N924 Excessive bleeding in the premenopausal period: Secondary | ICD-10-CM | POA: Diagnosis not present

## 2021-10-11 ENCOUNTER — Inpatient Hospital Stay: Payer: BC Managed Care – PPO | Admitting: Family

## 2021-10-11 ENCOUNTER — Inpatient Hospital Stay: Payer: BC Managed Care – PPO

## 2021-10-11 ENCOUNTER — Telehealth: Payer: Self-pay | Admitting: *Deleted

## 2021-10-11 NOTE — Telephone Encounter (Signed)
Patient called to cancel appointment with Sarah on 10/11/21 due to not feeling well and will call back to reschedule.

## 2022-01-15 ENCOUNTER — Other Ambulatory Visit: Payer: Self-pay | Admitting: Family Medicine

## 2022-01-15 DIAGNOSIS — N939 Abnormal uterine and vaginal bleeding, unspecified: Secondary | ICD-10-CM

## 2022-07-03 ENCOUNTER — Other Ambulatory Visit: Payer: Self-pay | Admitting: Family Medicine

## 2022-07-03 DIAGNOSIS — N939 Abnormal uterine and vaginal bleeding, unspecified: Secondary | ICD-10-CM

## 2022-07-13 IMAGING — MG MM DIGITAL DIAGNOSTIC UNILAT*R* W/ TOMO W/ CAD
6 series · 6 of 14 positions shown · non-contrast
Comparison: Previous exam(s).

CLINICAL DATA: Short-term follow-up for likely benign right breast
calcifications.

EXAM:
DIGITAL DIAGNOSTIC UNILATERAL RIGHT MAMMOGRAM WITH TOMO AND CAD

[R ML]
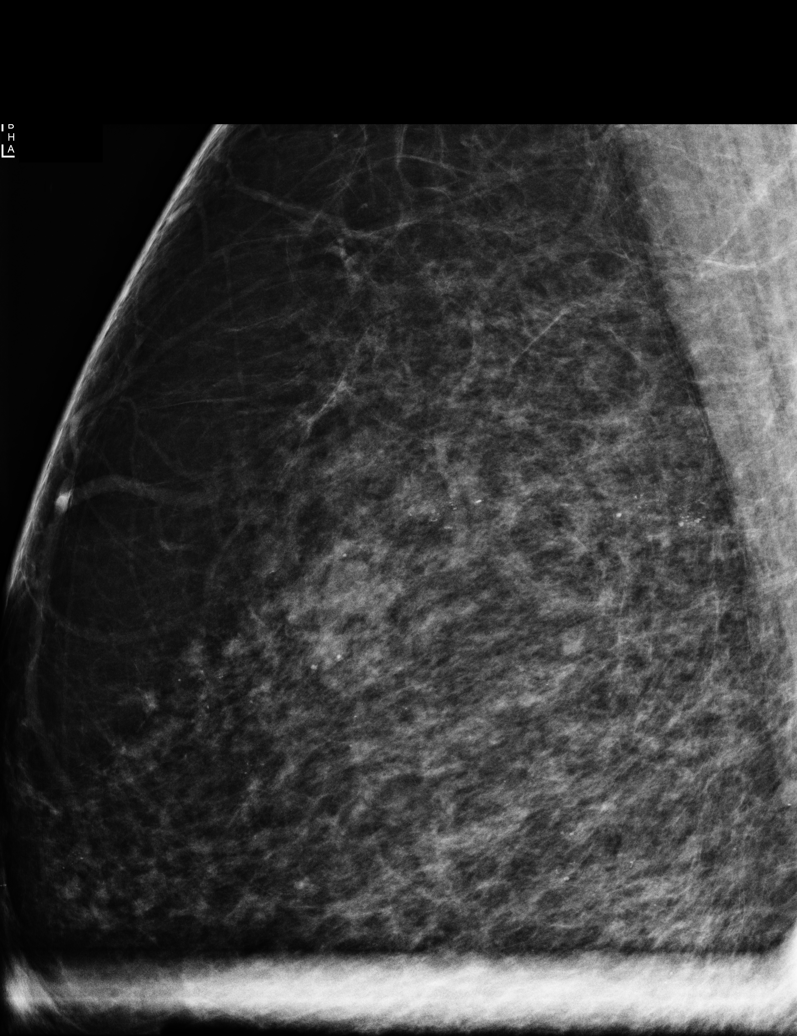

[R CC]
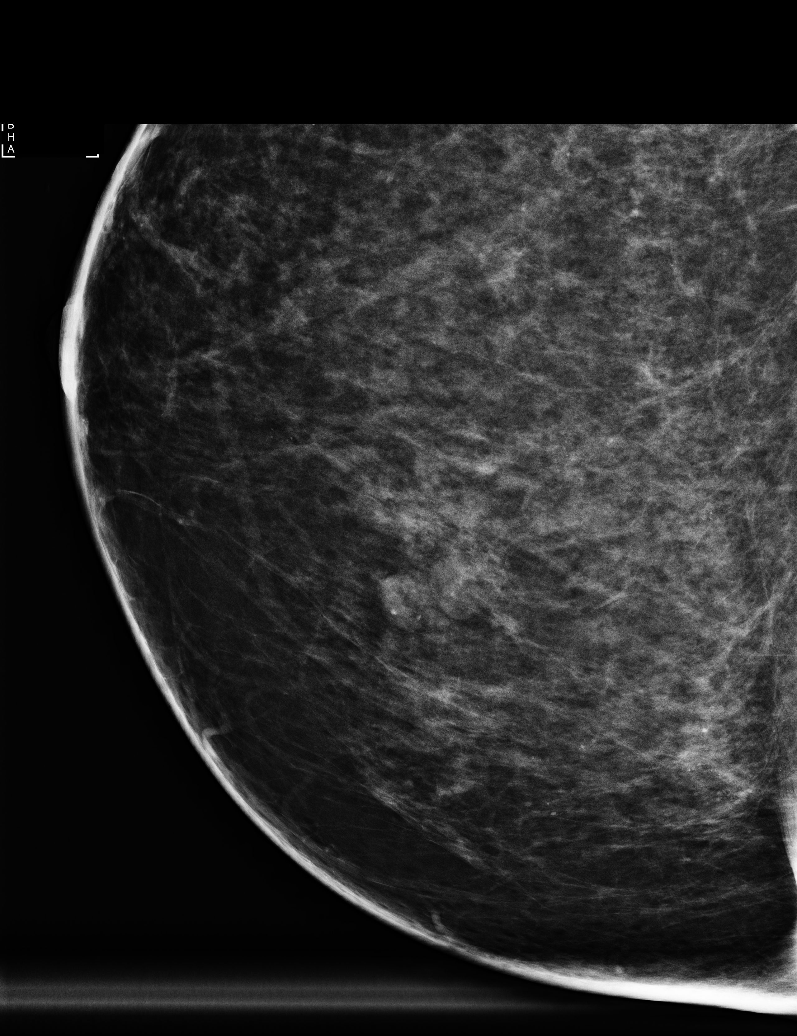

[R MLO synth-2D]
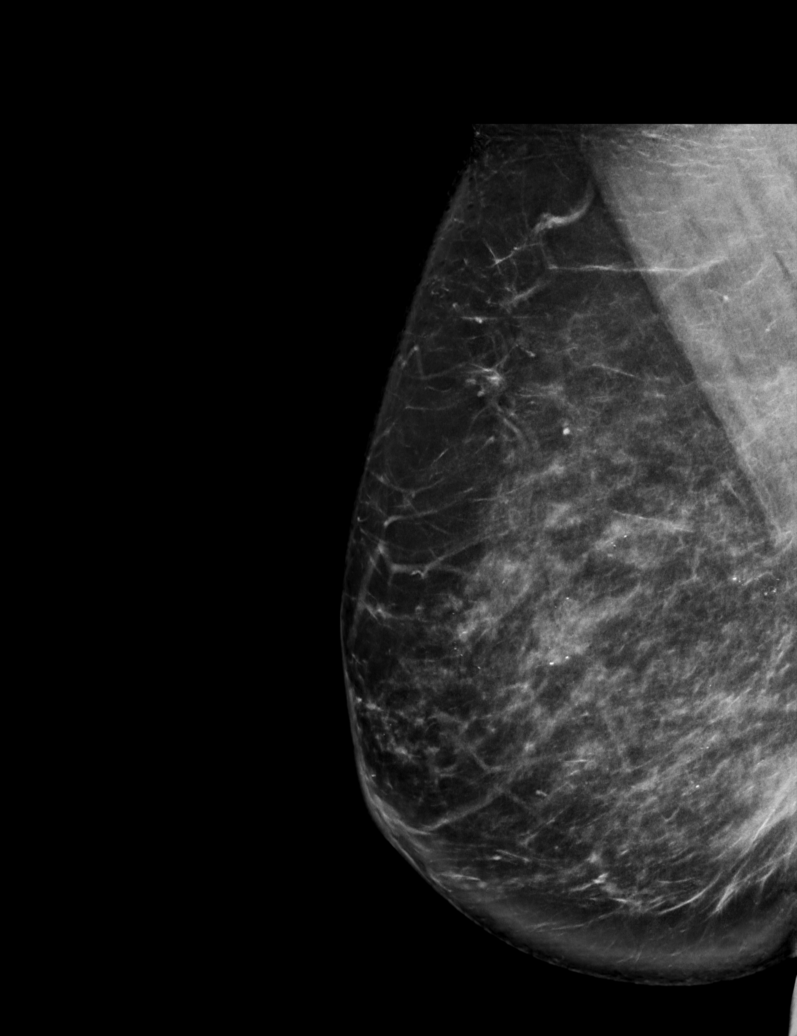

[R CC synth-2D]
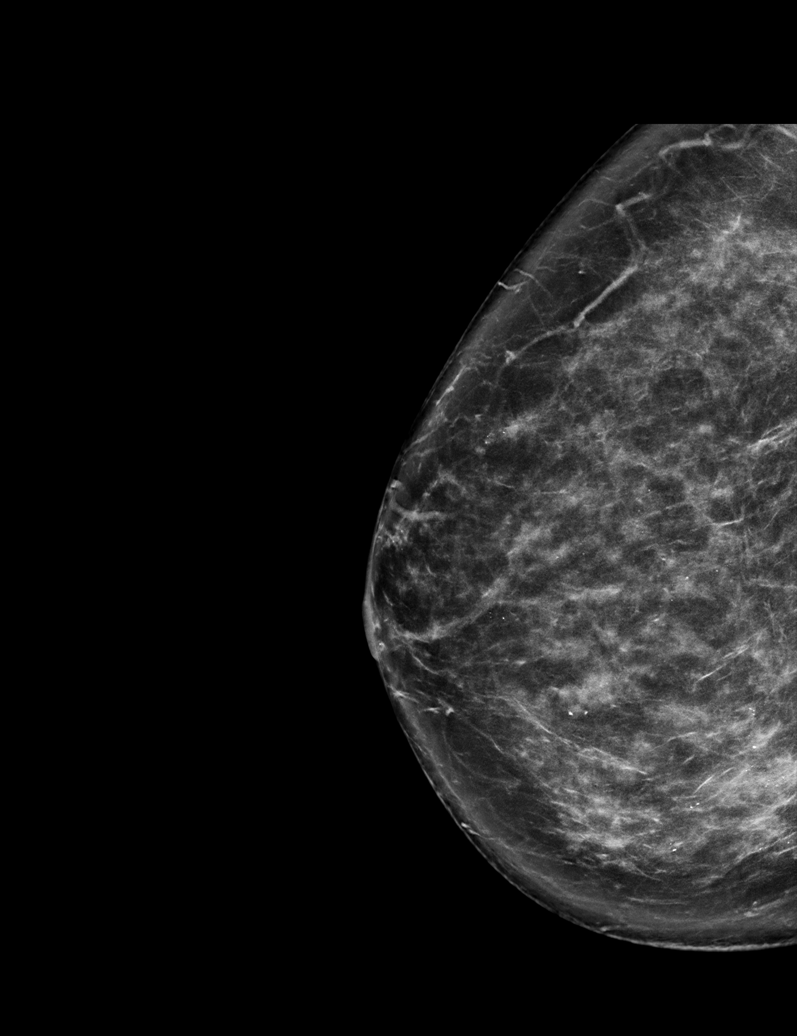

[R CC tomo · tomo slice 41/81.0]
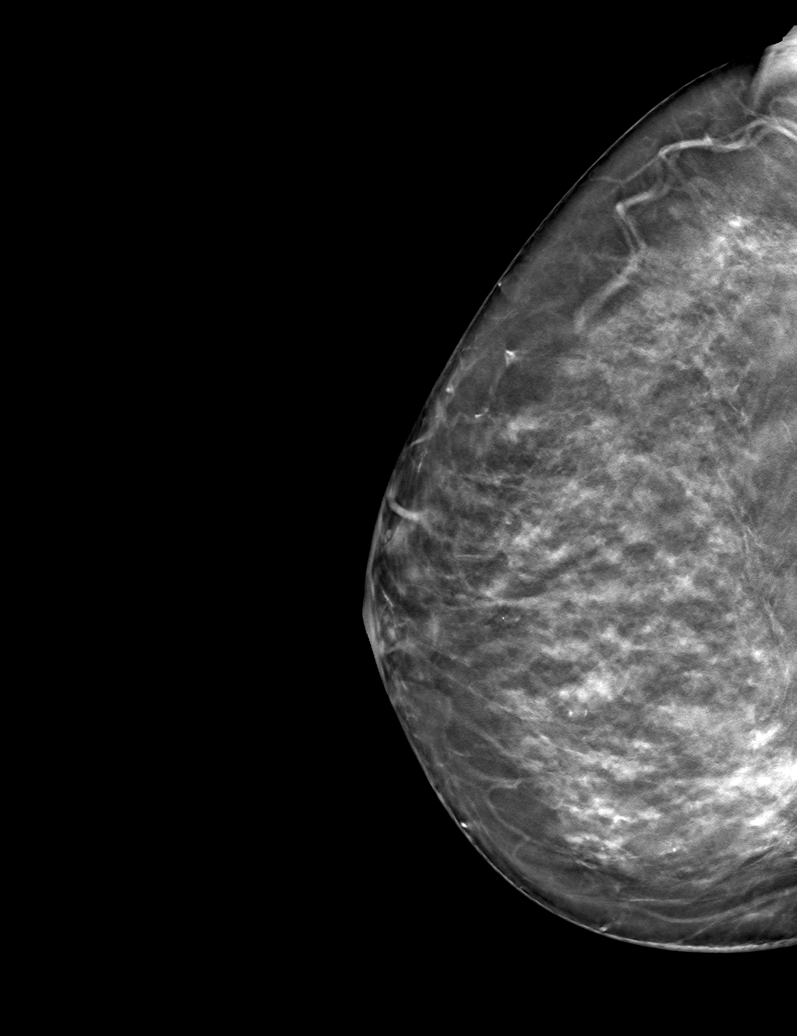

[R MLO tomo · tomo slice 45/90.0]
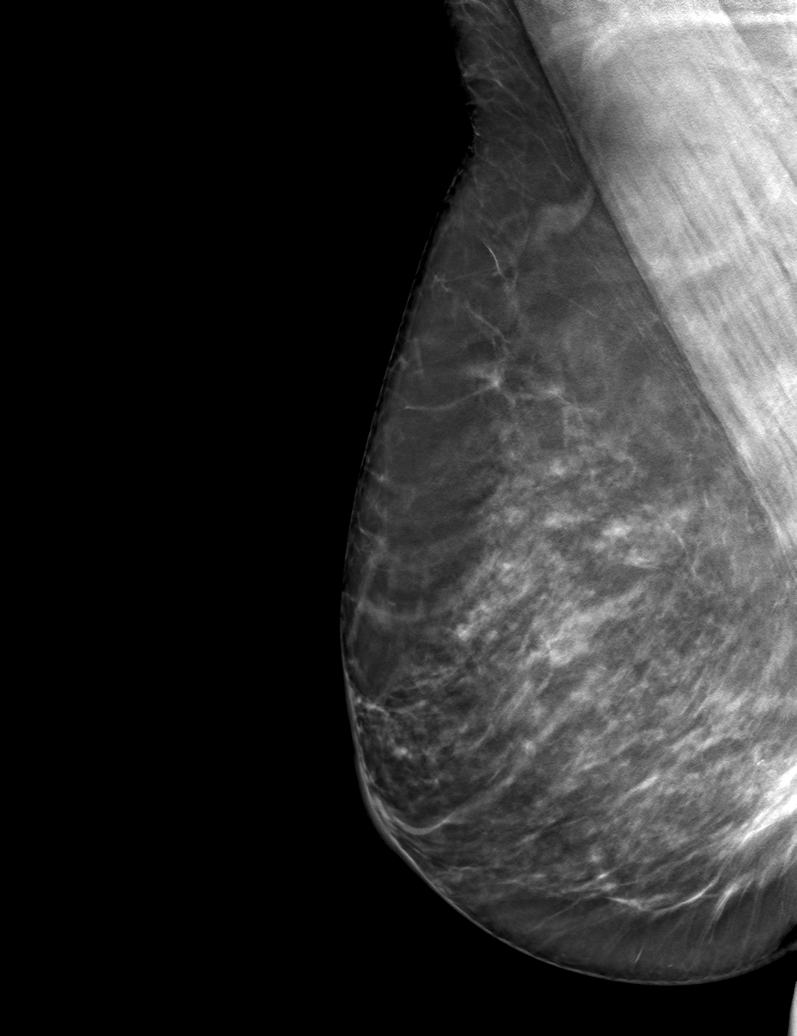

[6 of 14 positions shown; findings below may reference images not displayed]

ACR Breast Density Category c: The breast tissue is heterogeneously
dense, which may obscure small masses.
FINDINGS: Calcifications in the upper inner right breast are stable, likely
representing milk of calcium. No suspicious calcifications, masses
or areas of distortion are seen in the right breast.

Mammographic images were processed with CAD.
IMPRESSION: Stable likely benign calcifications in the upper inner right breast.

RECOMMENDATION:
Bilateral diagnostic mammogram in Monday June, 2020.

I have discussed the findings and recommendations with the patient.
If applicable, a reminder letter will be sent to the patient
regarding the next appointment.

BI-RADS CATEGORY  3: Probably benign.

## 2022-08-14 ENCOUNTER — Encounter: Payer: Self-pay | Admitting: Family

## 2022-08-15 NOTE — Progress Notes (Deleted)
[facility] at [facility] [address], [address] [place], [place] [address] [phone]  Date:  08/21/2022   Name:  Lynn Smith   DOB:  1970/06/23   MRN:  [id number]  PCP:  Pearline Cables, MD    Chief Complaint: No chief complaint on file.   History of Present Illness:  Lynn Smith is a 52 y.o. 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  Indeed, we checked her lab work she showed evidence of iron deficiency anemia with a hemoglobin of 9.3.  We had her follow-up with GYN, Dr. Renaldo Fiddler who saw her most recently in June-as well as hematology She had an iron infusion, most recently in May of last year-she may have been lost to follow-up or perhaps had more definitive treatment per GYN?  Losartan 50 Colonoscopy completed 2022 Pap smear April 2023, normal Patient Active Problem List   Diagnosis Date Noted   IDA (iron deficiency anemia) 08/29/2021   Pre-diabetes 06/02/2019   Dyslipidemia 06/02/2019    Past Medical History:  Diagnosis Date   Allergy    History of C-section    Hypertension     Past Surgical History:  Procedure Laterality Date   CESAREAN SECTION     LEEP  1998    Social History   Tobacco Use   Smoking status: Never   Smokeless tobacco: Never  Substance Use Topics   Alcohol use: No   Drug use: No    Family History  Problem Relation Age of Onset   Hypertension Mother    Cancer Mother        uterus   Hypertension Father    Cancer Father        colon and lung   Colon cancer  Father    Hypertension Sister    Esophageal cancer Neg Hx    Stomach cancer Neg Hx    Rectal cancer Neg Hx     No Known Allergies  Medication list has been reviewed and updated.  Current Outpatient Medications on File Prior to Visit  Medication Sig Dispense Refill   ascorbic acid (VITAMIN C) 1000 MG tablet Take 1,000 mg by mouth daily. (Patient not taking: Reported on 08/29/2021)     Barberry-Oreg Grape-Goldenseal (BERBERINE COMPLEX PO) Take 1 tablet by mouth daily. (Patient not taking: Reported on 08/29/2021)     Cholecalciferol (VITAMIN D3 PO) Take by mouth. (Patient not taking: Reported on 08/29/2021)     Chromium 500 MCG TABS Take 500 mcg by mouth daily. (Patient not taking: Reported on 08/29/2021)     ELDERBERRY PO Take 1 capsule by mouth daily. (Patient not taking: Reported on 08/29/2021)     fluticasone (FLONASE) 50 MCG/ACT nasal spray Place 2 sprays into both nostrils daily. (Patient not taking: Reported on 08/29/2021) 16 g 1   Lactobacillus (ACIDOPHILUS PO) Take by mouth. (Patient not taking: Reported on 08/29/2021)     levocetirizine (XYZAL) 5 MG tablet Take 1 tablet (5 mg total) by mouth every evening. (Patient not taking:  Reported on 08/29/2021) 30 tablet 3   losartan (COZAAR) 50 MG tablet Take 1.5 tablets (75 mg total) by mouth daily. 135 tablet 3   meclizine (ANTIVERT) 25 MG tablet Take 1 tablet (25 mg total) by mouth 3 (three) times daily as needed for up to 30 doses for dizziness. (Patient not taking: Reported on 08/29/2021) 30 tablet 0   Multiple Vitamin (MULTIVITAMIN) tablet Take 1 tablet by mouth daily. (Patient not taking: Reported on 08/29/2021)     norethindrone (INCASSIA) 0.35 MG tablet TAKE 1 TABLET BY MOUTH EVERY DAY 84 tablet 0   PHYTOSTEROLS PO Take 1 capsule by mouth daily.  (Patient not taking: Reported on 08/29/2021)     Polyethylene Glycol 3350 (MIRALAX PO) Take 238 g by mouth. Colonoscopy prep (Patient not taking: Reported on 08/29/2021)     Turmeric 500 MG CAPS Take 500 mg by  mouth daily. (Patient not taking: Reported on 08/29/2021)     zinc gluconate 50 MG tablet Take 50 mg by mouth daily. (Patient not taking: Reported on 08/29/2021)     No current facility-administered medications on file prior to visit.    Review of Systems:  As per HPI- otherwise negative.   Physical Examination: There were no vitals filed for this visit. There were no vitals filed for this visit. There is no height or weight on file to calculate BMI. Ideal Body Weight:    GEN: no acute distress. HEENT: Atraumatic, Normocephalic.  Ears and Nose: No external deformity. CV: RRR, No M/G/R. No JVD. No thrill. No extra heart sounds. PULM: CTA B, no wheezes, crackles, rhonchi. No retractions. No resp. distress. No accessory muscle use. ABD: S, NT, ND, +BS. No rebound. No HSM. EXTR: No c/c/e PSYCH: Normally interactive. Conversant.    Assessment and Plan: ***  Signed Abbe Amsterdam, MD

## 2022-08-21 ENCOUNTER — Encounter: Payer: BC Managed Care – PPO | Admitting: Family Medicine

## 2022-08-21 DIAGNOSIS — D500 Iron deficiency anemia secondary to blood loss (chronic): Secondary | ICD-10-CM

## 2022-08-21 DIAGNOSIS — Z0000 Encounter for general adult medical examination without abnormal findings: Secondary | ICD-10-CM

## 2022-08-21 DIAGNOSIS — I10 Essential (primary) hypertension: Secondary | ICD-10-CM

## 2022-08-21 DIAGNOSIS — R7303 Prediabetes: Secondary | ICD-10-CM

## 2022-08-22 ENCOUNTER — Other Ambulatory Visit: Payer: Self-pay | Admitting: Family Medicine

## 2022-08-22 DIAGNOSIS — I10 Essential (primary) hypertension: Secondary | ICD-10-CM

## 2022-08-22 NOTE — Patient Instructions (Incomplete)
It was great to see again today, I will be in touch with your labs soon as possible Recommend COVID booster if not done in the last 9 months or so Recommend shingles vaccine series at your convenience For allergies- recommend a nasal steroid spray such as Flonase/ Nasacort (generic is ok!) and also perhaps zyrtec/ claritin

## 2022-08-22 NOTE — Progress Notes (Unsigned)
[facility] at [facility] [address] [place], [place] [address] [phone]  Date:  08/24/2022   Name:  Kelliann Pendergraph   DOB:  December 09, 1970   MRN:  [id number]  PCP:  Pearline Cables, MD    Chief Complaint: No chief complaint on file.   History of Present Illness:  Denaja Verhoeven is a 52 y.o. 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 Renaldo Fiddler We also found iron deficiency anemia due to heavy menstrual bleeding, she was seen by hematology and treated with IV iron Most recent labs on chart from May 2023  Shingrix Recommend COVID booster Mammogram, Pap up-to-date Colonoscopy completed 2020 Patient Active Problem List   Diagnosis Date Noted   IDA (iron deficiency anemia) 08/29/2021   Pre-diabetes 06/02/2019   Dyslipidemia 06/02/2019    Past Medical History:  Diagnosis Date   Allergy    History of C-section    Hypertension     Past Surgical History:  Procedure Laterality Date   CESAREAN SECTION     LEEP  1998    Social History   Tobacco Use   Smoking status: Never   Smokeless tobacco: Never  Substance Use Topics   Alcohol use: No   Drug use: No    Family History  Problem Relation Age of Onset   Hypertension Mother    Cancer Mother        uterus   Hypertension Father    Cancer Father        colon and lung   Colon cancer Father    Hypertension Sister    Esophageal cancer Neg Hx    Stomach cancer Neg Hx    Rectal cancer Neg Hx     No Known Allergies  Medication list has  been reviewed and updated.  Current Outpatient Medications on File Prior to Visit  Medication Sig Dispense Refill   ascorbic acid (VITAMIN C) 1000 MG tablet Take 1,000 mg by mouth daily. (Patient not taking: Reported on 08/29/2021)     Barberry-Oreg Grape-Goldenseal (BERBERINE COMPLEX PO) Take 1 tablet by mouth daily. (Patient not taking: Reported on 08/29/2021)     Cholecalciferol (VITAMIN D3 PO) Take by mouth. (Patient not taking: Reported on 08/29/2021)     Chromium 500 MCG TABS Take 500 mcg by mouth daily. (Patient not taking: Reported on 08/29/2021)     ELDERBERRY PO Take 1 capsule by mouth daily. (Patient not taking: Reported on 08/29/2021)     fluticasone (FLONASE) 50 MCG/ACT nasal spray Place 2 sprays into both nostrils daily. (Patient not taking: Reported on 08/29/2021) 16 g 1   Lactobacillus (ACIDOPHILUS PO) Take by mouth. (Patient not taking: Reported on 08/29/2021)     levocetirizine (XYZAL) 5 MG tablet Take 1 tablet (5 mg total) by mouth every evening. (Patient not taking: Reported on 08/29/2021) 30 tablet 3   losartan (COZAAR) 50 MG tablet Take 1.5 tablets (75 mg total) by mouth daily. 135 tablet 3   meclizine (ANTIVERT) 25  MG tablet Take 1 tablet (25 mg total) by mouth 3 (three) times daily as needed for up to 30 doses for dizziness. (Patient not taking: Reported on 08/29/2021) 30 tablet 0   Multiple Vitamin (MULTIVITAMIN) tablet Take 1 tablet by mouth daily. (Patient not taking: Reported on 08/29/2021)     norethindrone (INCASSIA) 0.35 MG tablet TAKE 1 TABLET BY MOUTH EVERY DAY 84 tablet 0   PHYTOSTEROLS PO Take 1 capsule by mouth daily.  (Patient not taking: Reported on 08/29/2021)     Polyethylene Glycol 3350 (MIRALAX PO) Take 238 g by mouth. Colonoscopy prep (Patient not taking: Reported on 08/29/2021)     Turmeric 500 MG CAPS Take 500 mg by mouth daily. (Patient not taking: Reported on 08/29/2021)     zinc gluconate 50 MG tablet Take 50 mg by mouth daily. (Patient not taking: Reported on 08/29/2021)      No current facility-administered medications on file prior to visit.    Review of Systems:  As per HPI- otherwise negative.   Physical Examination: There were no vitals filed for this visit. There were no vitals filed for this visit. There is no height or weight on file to calculate BMI. Ideal Body Weight:    GEN: no acute distress. HEENT: Atraumatic, Normocephalic.  Ears and Nose: No external deformity. CV: RRR, No M/G/R. No JVD. No thrill. No extra heart sounds. PULM: CTA B, no wheezes, crackles, rhonchi. No retractions. No resp. distress. No accessory muscle use. ABD: S, NT, ND, +BS. No rebound. No HSM. EXTR: No c/c/e PSYCH: Normally interactive. Conversant.    Assessment and Plan: *** Physical exam today.  Encouraged healthy diet and exercise routine Will plan further follow- up pending labs.  Signed Abbe Amsterdam, MD

## 2022-08-23 ENCOUNTER — Encounter: Payer: Self-pay | Admitting: Family

## 2022-08-24 ENCOUNTER — Ambulatory Visit (INDEPENDENT_AMBULATORY_CARE_PROVIDER_SITE_OTHER): Payer: 59 | Admitting: Family Medicine

## 2022-08-24 ENCOUNTER — Encounter: Payer: Self-pay | Admitting: Family Medicine

## 2022-08-24 VITALS — BP 122/84 | HR 95 | Temp 99.0°F | Resp 18 | Ht 68.0 in | Wt 202.8 lb

## 2022-08-24 DIAGNOSIS — R5383 Other fatigue: Secondary | ICD-10-CM | POA: Diagnosis not present

## 2022-08-24 DIAGNOSIS — E611 Iron deficiency: Secondary | ICD-10-CM | POA: Diagnosis not present

## 2022-08-24 DIAGNOSIS — Z13220 Encounter for screening for lipoid disorders: Secondary | ICD-10-CM | POA: Diagnosis not present

## 2022-08-24 DIAGNOSIS — I10 Essential (primary) hypertension: Secondary | ICD-10-CM

## 2022-08-24 DIAGNOSIS — Z131 Encounter for screening for diabetes mellitus: Secondary | ICD-10-CM | POA: Diagnosis not present

## 2022-08-24 DIAGNOSIS — N926 Irregular menstruation, unspecified: Secondary | ICD-10-CM | POA: Diagnosis not present

## 2022-08-24 DIAGNOSIS — Z1329 Encounter for screening for other suspected endocrine disorder: Secondary | ICD-10-CM

## 2022-08-24 DIAGNOSIS — Z0000 Encounter for general adult medical examination without abnormal findings: Secondary | ICD-10-CM

## 2022-08-24 DIAGNOSIS — Z1231 Encounter for screening mammogram for malignant neoplasm of breast: Secondary | ICD-10-CM

## 2022-08-24 LAB — LIPID PANEL
Cholesterol: 195 mg/dL (ref 0–200)
HDL: 54.6 mg/dL (ref >39.00)
LDL Cholesterol: 126 mg/dL — ABNORMAL HIGH (ref 0–99)
NonHDL: 140.23
Total CHOL/HDL Ratio: 4
Triglycerides: 70 mg/dL (ref 0.0–149.0)
VLDL: 14 mg/dL (ref 0.0–40.0)

## 2022-08-24 LAB — COMPREHENSIVE METABOLIC PANEL
ALT: 17 U/L (ref 0–35)
AST: 15 U/L (ref 0–37)
Albumin: 4.1 g/dL (ref 3.5–5.2)
Alkaline Phosphatase: 80 U/L (ref 39–117)
BUN: 10 mg/dL (ref 6–23)
CO2: 24 mEq/L (ref 19–32)
Calcium: 9.7 mg/dL (ref 8.4–10.5)
Chloride: 107 mEq/L (ref 96–112)
Creatinine, Ser: 0.93 mg/dL (ref 0.40–1.20)
GFR: 70.3 mL/min (ref >60.00)
Glucose, Bld: 96 mg/dL (ref 70–99)
Potassium: 4.4 mEq/L (ref 3.5–5.1)
Sodium: 139 mEq/L (ref 135–145)
Total Bilirubin: 0.5 mg/dL (ref 0.2–1.2)
Total Protein: 7.5 g/dL (ref 6.0–8.3)

## 2022-08-24 LAB — CBC
HCT: 39.2 % (ref 36.0–46.0)
Hemoglobin: 13 g/dL (ref 12.0–15.0)
MCHC: 33.2 g/dL (ref 30.0–36.0)
MCV: 83.9 fl (ref 78.0–100.0)
Platelets: 377 10*3/uL (ref 150.0–400.0)
RBC: 4.67 Mil/uL (ref 3.87–5.11)
RDW: 16.6 % — ABNORMAL HIGH (ref 11.5–15.5)
WBC: 5.1 10*3/uL (ref 4.0–10.5)

## 2022-08-24 LAB — VITAMIN D 25 HYDROXY (VIT D DEFICIENCY, FRACTURES): VITD: 22.08 ng/mL — ABNORMAL LOW (ref 30.00–100.00)

## 2022-08-24 LAB — HEMOGLOBIN A1C: Hgb A1c MFr Bld: 6.3 % (ref 4.6–6.5)

## 2022-08-24 LAB — FOLLICLE STIMULATING HORMONE: FSH: 69.4 m[IU]/mL

## 2022-08-24 LAB — FERRITIN: Ferritin: 5.3 ng/mL — ABNORMAL LOW (ref 10.0–291.0)

## 2022-08-24 LAB — TSH: TSH: 1.21 u[IU]/mL (ref 0.35–5.50)

## 2022-08-25 ENCOUNTER — Other Ambulatory Visit: Payer: Self-pay | Admitting: Family

## 2022-08-25 ENCOUNTER — Telehealth: Payer: Self-pay | Admitting: *Deleted

## 2022-08-25 ENCOUNTER — Other Ambulatory Visit: Payer: Self-pay | Admitting: Family Medicine

## 2022-08-25 DIAGNOSIS — E559 Vitamin D deficiency, unspecified: Secondary | ICD-10-CM

## 2022-08-25 DIAGNOSIS — E611 Iron deficiency: Secondary | ICD-10-CM

## 2022-08-25 NOTE — Telephone Encounter (Signed)
Per results message Tiffany - called patient and lvm for a callback to schedule a follow up with Maralyn Sago ans (1) dose of IV Iron (Ferrlecit)

## 2022-08-25 NOTE — Telephone Encounter (Signed)
Per result message Lynn Smith - called patient and lvm for a callback to schedule a follow up with Maralyn Sago + (1) dose of IV Iron (Ferrlecit)

## 2022-08-28 ENCOUNTER — Ambulatory Visit (HOSPITAL_BASED_OUTPATIENT_CLINIC_OR_DEPARTMENT_OTHER)
Admission: RE | Admit: 2022-08-28 | Discharge: 2022-08-28 | Disposition: A | Discharge status: Home / Self Care | Payer: 59 | Source: Ambulatory Visit | Attending: Family Medicine | Admitting: Family Medicine

## 2022-08-28 ENCOUNTER — Encounter (HOSPITAL_BASED_OUTPATIENT_CLINIC_OR_DEPARTMENT_OTHER): Payer: Self-pay

## 2022-08-28 DIAGNOSIS — Z1231 Encounter for screening mammogram for malignant neoplasm of breast: Secondary | ICD-10-CM | POA: Insufficient documentation

## 2022-08-29 ENCOUNTER — Telehealth: Payer: Self-pay | Admitting: *Deleted

## 2022-08-29 NOTE — Telephone Encounter (Signed)
Per results message - Called patient again and finally was able to reach her. She said that there was miscommunication. She said that she told Dr. Patsy Lager that she requested to take supplements first before going further. She stated that she did not want to schedule a follow up until she try the supplements first.

## 2022-08-30 ENCOUNTER — Other Ambulatory Visit: Payer: Self-pay | Admitting: Family Medicine

## 2022-08-30 DIAGNOSIS — R928 Other abnormal and inconclusive findings on diagnostic imaging of breast: Secondary | ICD-10-CM

## 2022-09-06 ENCOUNTER — Other Ambulatory Visit: Payer: Self-pay | Admitting: Family Medicine

## 2022-09-06 ENCOUNTER — Ambulatory Visit
Admission: RE | Admit: 2022-09-06 | Discharge: 2022-09-06 | Disposition: A | Discharge status: Home / Self Care | Payer: 59 | Source: Ambulatory Visit | Attending: Family Medicine | Admitting: Family Medicine

## 2022-09-06 DIAGNOSIS — R928 Other abnormal and inconclusive findings on diagnostic imaging of breast: Secondary | ICD-10-CM

## 2022-09-21 ENCOUNTER — Other Ambulatory Visit: Payer: Self-pay | Admitting: Family Medicine

## 2022-09-21 DIAGNOSIS — N939 Abnormal uterine and vaginal bleeding, unspecified: Secondary | ICD-10-CM

## 2023-08-25 NOTE — Progress Notes (Unsigned)
 [facility] [address], [address] [place], [place] [address] [phone]  Date:  08/27/2023   Name:  Lynn Smith   DOB:  01-18-1971   MRN:  [id number]  PCP:  Kaylee Partridge, MD    Chief Complaint: No chief complaint on file.   History of Present Illness:  Lynn Smith is a 53 y.o. very pleasant female patient who presents with the following:  Patient seen today for physical exam.  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 Avis Boehringer  Shingrix Mammogram can be updated later this month Pap smear up-to-date Colonoscopy up-to-date Labs 1 year ago-at that time we saw low iron as well as low vitamin D   She has received iron infusions previously but for now has been using oral iron  Patient Active Problem List   Diagnosis Date Noted   IDA (iron deficiency anemia) 08/29/2021   Pre-diabetes 06/02/2019   Dyslipidemia 06/02/2019    Past Medical History:  Diagnosis Date   Allergy    History of C-section    Hypertension     Past Surgical History:  Procedure Laterality Date   CESAREAN SECTION     LEEP  1998    Social History   Tobacco Use   Smoking status: Never   Smokeless tobacco: Never  Substance Use Topics   Alcohol use: No   Drug use: No    Family History  Problem Relation Age of Onset   Hypertension Mother    Cancer Mother        uterus   Hypertension Father    Cancer Father        colon and lung   Colon cancer Father    Hypertension Sister    Esophageal cancer Neg Hx    Stomach cancer Neg Hx    Rectal cancer Neg Hx     No Known Allergies  Medication list has been reviewed  and updated.  Current Outpatient Medications on File Prior to Visit  Medication Sig Dispense Refill   BIOTIN PO Take by mouth.     losartan  (COZAAR ) 50 MG tablet TAKE 1 AND 1/2 TABLETS BY MOUTH DAILY 135 tablet 3   Multiple Vitamin (MULTIVITAMIN) tablet Take 1 tablet by mouth daily.     norethindrone (INCASSIA) 0.35 MG tablet TAKE 1 TABLET BY MOUTH EVERY DAY 84 tablet 3   No current facility-administered medications on file prior to visit.    Review of Systems:  As per HPI- otherwise negative.   Physical Examination: There were no vitals filed for this visit. There were no vitals filed for this visit. There is no height or weight on file to calculate BMI. Ideal Body Weight:    GEN: no acute distress. HEENT: Atraumatic, Normocephalic.  Ears and Nose: No external deformity. CV: RRR, No M/G/R. No JVD. No thrill. No extra heart sounds. PULM: CTA B, no wheezes, crackles, rhonchi. No retractions. No resp. distress. No accessory muscle use. ABD: S, NT, ND, +BS. No rebound. No HSM. EXTR: No c/c/e PSYCH: Normally interactive. Conversant.    Assessment and Plan: *** Physical  exam today.  Encouraged healthy diet and exercise routine  Will plan further follow- up pending labs.  Signed Gates Kasal, MD

## 2023-08-25 NOTE — Patient Instructions (Signed)
 It was good to see you again today, I will be in touch with your lab work

## 2023-08-27 ENCOUNTER — Encounter: Payer: Self-pay | Admitting: Family Medicine

## 2023-08-27 ENCOUNTER — Ambulatory Visit (INDEPENDENT_AMBULATORY_CARE_PROVIDER_SITE_OTHER): Payer: 59 | Admitting: Family Medicine

## 2023-08-27 VITALS — BP 138/86 | HR 90 | Temp 98.7°F | Ht 68.0 in | Wt 217.0 lb

## 2023-08-27 DIAGNOSIS — Z23 Encounter for immunization: Secondary | ICD-10-CM

## 2023-08-27 DIAGNOSIS — F5102 Adjustment insomnia: Secondary | ICD-10-CM | POA: Diagnosis not present

## 2023-08-27 DIAGNOSIS — Z0000 Encounter for general adult medical examination without abnormal findings: Secondary | ICD-10-CM

## 2023-08-27 DIAGNOSIS — Z1329 Encounter for screening for other suspected endocrine disorder: Secondary | ICD-10-CM

## 2023-08-27 DIAGNOSIS — Z13220 Encounter for screening for lipoid disorders: Secondary | ICD-10-CM

## 2023-08-27 DIAGNOSIS — E611 Iron deficiency: Secondary | ICD-10-CM | POA: Diagnosis not present

## 2023-08-27 DIAGNOSIS — Z131 Encounter for screening for diabetes mellitus: Secondary | ICD-10-CM | POA: Diagnosis not present

## 2023-08-27 DIAGNOSIS — E559 Vitamin D deficiency, unspecified: Secondary | ICD-10-CM | POA: Diagnosis not present

## 2023-08-27 DIAGNOSIS — I10 Essential (primary) hypertension: Secondary | ICD-10-CM | POA: Diagnosis not present

## 2023-08-27 DIAGNOSIS — Z1231 Encounter for screening mammogram for malignant neoplasm of breast: Secondary | ICD-10-CM

## 2023-08-27 LAB — COMPREHENSIVE METABOLIC PANEL WITH GFR
ALT: 27 U/L (ref 0–35)
AST: 18 U/L (ref 0–37)
Albumin: 4.2 g/dL (ref 3.5–5.2)
Alkaline Phosphatase: 95 U/L (ref 39–117)
BUN: 13 mg/dL (ref 6–23)
CO2: 27 meq/L (ref 19–32)
Calcium: 9.8 mg/dL (ref 8.4–10.5)
Chloride: 105 meq/L (ref 96–112)
Creatinine, Ser: 0.85 mg/dL (ref 0.40–1.20)
GFR: 77.76 mL/min (ref >60.00)
Glucose, Bld: 83 mg/dL (ref 70–99)
Potassium: 4.1 meq/L (ref 3.5–5.1)
Sodium: 140 meq/L (ref 135–145)
Total Bilirubin: 0.7 mg/dL (ref 0.2–1.2)
Total Protein: 7.3 g/dL (ref 6.0–8.3)

## 2023-08-27 LAB — LIPID PANEL
Cholesterol: 242 mg/dL — ABNORMAL HIGH (ref 0–200)
HDL: 62.7 mg/dL (ref >39.00)
LDL Cholesterol: 163 mg/dL — ABNORMAL HIGH (ref 0–99)
NonHDL: 179.2
Total CHOL/HDL Ratio: 4
Triglycerides: 83 mg/dL (ref 0.0–149.0)
VLDL: 16.6 mg/dL (ref 0.0–40.0)

## 2023-08-27 LAB — CBC
HCT: 39.8 % (ref 36.0–46.0)
Hemoglobin: 13.3 g/dL (ref 12.0–15.0)
MCHC: 33.5 g/dL (ref 30.0–36.0)
MCV: 91.5 fl (ref 78.0–100.0)
Platelets: 350 10*3/uL (ref 150.0–400.0)
RBC: 4.35 Mil/uL (ref 3.87–5.11)
RDW: 13.2 % (ref 11.5–15.5)
WBC: 5.3 10*3/uL (ref 4.0–10.5)

## 2023-08-27 LAB — TSH: TSH: 3.72 u[IU]/mL (ref 0.35–5.50)

## 2023-08-27 LAB — VITAMIN D 25 HYDROXY (VIT D DEFICIENCY, FRACTURES): VITD: 25.3 ng/mL — ABNORMAL LOW (ref 30.00–100.00)

## 2023-08-27 LAB — FERRITIN: Ferritin: 69 ng/mL (ref 10.0–291.0)

## 2023-08-27 LAB — HEMOGLOBIN A1C: Hgb A1c MFr Bld: 6.3 % (ref 4.6–6.5)

## 2023-08-27 MED ORDER — TRAZODONE HCL 50 MG PO TABS
25.0000 mg | ORAL_TABLET | Freq: Every evening | ORAL | 3 refills | Status: DC | PRN
Start: 2023-08-27 — End: 2023-10-19

## 2023-08-27 MED ORDER — LOSARTAN POTASSIUM 50 MG PO TABS
75.0000 mg | ORAL_TABLET | Freq: Every day | ORAL | 3 refills | Status: AC
Start: 2023-08-27 — End: ?

## 2023-08-28 DIAGNOSIS — Z23 Encounter for immunization: Secondary | ICD-10-CM | POA: Diagnosis not present

## 2023-09-10 ENCOUNTER — Ambulatory Visit (HOSPITAL_BASED_OUTPATIENT_CLINIC_OR_DEPARTMENT_OTHER)
Admission: RE | Admit: 2023-09-10 | Discharge: 2023-09-10 | Disposition: A | Discharge status: Home / Self Care | Source: Ambulatory Visit | Attending: Family Medicine | Admitting: Family Medicine

## 2023-09-10 ENCOUNTER — Encounter (HOSPITAL_BASED_OUTPATIENT_CLINIC_OR_DEPARTMENT_OTHER): Payer: Self-pay

## 2023-09-10 DIAGNOSIS — Z1231 Encounter for screening mammogram for malignant neoplasm of breast: Secondary | ICD-10-CM | POA: Insufficient documentation

## 2023-10-19 ENCOUNTER — Other Ambulatory Visit: Payer: Self-pay | Admitting: Family Medicine

## 2023-10-19 DIAGNOSIS — F5102 Adjustment insomnia: Secondary | ICD-10-CM
